# Patient Record
Sex: Female | Born: 1985 | Race: White | Hispanic: No | Marital: Single | Smoking: Current every day smoker
Health system: Southern US, Community
[De-identification: ages and names within clinical notes are randomized; demographics above are authoritative.]

## PROBLEM LIST (undated history)

## (undated) DIAGNOSIS — Z789 Other specified health status: Secondary | ICD-10-CM

## (undated) HISTORY — PX: DILATION AND EVACUATION: SHX1459

---

## 2012-01-11 NOTE — L&D Delivery Note (Signed)
Delivery Note At 5:11 PM a viable female was delivered via Vaginal, Spontaneous Delivery (Presentation: Left Occiput Anterior).  APGAR: 7, 9; weight: 3549 grams .   Placenta status: Intact, Spontaneous.  Cord: 3 vessels with the following complications: None.  Cord pH: none  Anesthesia: Epidural  Episiotomy: None Lacerations: 1st degree Suture Repair: 2.0 chromic Est. Blood Loss (mL): 350  Mom to postpartum.  Baby to Couplet care / Skin to Skin.  Ladashia Demarinis A 12/24/2012, 5:56 PM

## 2012-08-02 ENCOUNTER — Ambulatory Visit (INDEPENDENT_AMBULATORY_CARE_PROVIDER_SITE_OTHER): Payer: Medicaid Other | Admitting: Obstetrics & Gynecology

## 2012-08-02 ENCOUNTER — Encounter: Payer: Self-pay | Admitting: Obstetrics & Gynecology

## 2012-08-02 VITALS — BP 125/78 | Temp 98.4°F | Ht 63.0 in | Wt 141.0 lb

## 2012-08-02 DIAGNOSIS — Z3400 Encounter for supervision of normal first pregnancy, unspecified trimester: Secondary | ICD-10-CM | POA: Insufficient documentation

## 2012-08-02 DIAGNOSIS — O99330 Smoking (tobacco) complicating pregnancy, unspecified trimester: Secondary | ICD-10-CM | POA: Insufficient documentation

## 2012-08-02 DIAGNOSIS — O99334 Smoking (tobacco) complicating childbirth: Secondary | ICD-10-CM

## 2012-08-02 DIAGNOSIS — Z3402 Encounter for supervision of normal first pregnancy, second trimester: Secondary | ICD-10-CM

## 2012-08-02 DIAGNOSIS — Z3201 Encounter for pregnancy test, result positive: Secondary | ICD-10-CM

## 2012-08-02 DIAGNOSIS — O99332 Smoking (tobacco) complicating pregnancy, second trimester: Secondary | ICD-10-CM

## 2012-08-02 LAB — POCT URINALYSIS DIPSTICK
Glucose, UA: NEGATIVE
Leukocytes, UA: NEGATIVE
Nitrite, UA: NEGATIVE
Spec Grav, UA: 1.015
Urobilinogen, UA: NEGATIVE

## 2012-08-02 MED ORDER — PRENATAL MULTIVITAMIN CH: 1.0000 | ORAL_TABLET | Freq: Every day | ORAL | Status: DC

## 2012-08-02 NOTE — Progress Notes (Signed)
Pt is doing well.

## 2012-08-02 NOTE — Progress Notes (Signed)
Pulse- 99 . Subjective:    Maria Compton is being seen today for her first obstetrical visit.  This is not a planned pregnancy. She is at [redacted]w[redacted]d gestation. Her obstetrical history is significant for smoker. Relationship with FOB: significant other, living together. Patient does intend to breast feed. Pregnancy history fully reviewed.  Menstrual History: OB History   Grav Para Term Preterm Abortions TAB SAB Ect Mult Living   3    2           Menarche age: 83 Patient's last menstrual period was 02/28/2012.    The following portions of the patient's history were reviewed and updated as appropriate: allergies, current medications, past family history, past medical history, past social history, past surgical history and problem list.  Review of Systems Pertinent items are noted in HPI.    Objective:   General Appearance:    Alert, cooperative, no distress, appears stated age  Head:    Normocephalic, without obvious abnormality, atraumatic  Eyes:    PERRL, conjunctiva/corneas clear, EOM's intact, fundi    benign, both eyes  Ears:    Normal TM's and external ear canals, both ears  Nose:   Nares normal, septum midline, mucosa normal, no drainage    or sinus tenderness  Throat:   Lips, mucosa, and tongue normal; teeth and gums normal  Neck:   Supple, symmetrical, trachea midline, no adenopathy;    thyroid:  no enlargement/tenderness/nodules; no carotid   bruit or JVD  Back:     Symmetric, no curvature, ROM normal, no CVA tenderness  Lungs:     Clear to auscultation bilaterally, respirations unlabored  Chest Wall:    No tenderness or deformity   Heart:    Regular rate and rhythm, S1 and S2 normal, no murmur, rub   or gallop  Breast Exam:    No tenderness, masses, or nipple abnormality  Abdomen:     Soft, non-tender, bowel sounds active all four quadrants,    no masses, no organomegaly  Genitalia:    Normal female without lesion, discharge or tenderness  Extremities:   Extremities  normal, atraumatic, no cyanosis or edema  Pulses:   2+ and symmetric all extremities  Skin:   Skin color, texture, turgor normal, no rashes or lesions  Lymph nodes:   Cervical, supraclavicular, and axillary nodes normal  Neurologic:   CNII-XII intact, normal strength, sensation and reflexes    throughout    Assessment:    Pregnancy at [redacted]w[redacted]d weeks    Plan:    Initial labs drawn. Prenatal vitamins.  Counseling provided regarding continued use of seat belts, cessation of alcohol consumption, smoking or use of illicit drugs; infection precautions i.e., influenza/TDAP immunizations, toxoplasmosis,CMV, parvovirus, listeria and varicella; workplace safety, exercise during pregnancy; routine dental care, safe medications, sexual activity, hot tubs, saunas, pools, travel, caffeine use, fish and methlymercury, potential toxins, hair treatments, varicose veins Weight gain recommendations reviewed: underweight/BMI< 18.5--> gain 28 - 40 lbs; normal weight/BMI 18.5 - 24.9--> gain 25 - 35 lbs; overweight/BMI 25 - 29.9--> gain 15 - 25 lbs; obese/BMI >30->gain  11 - 20 lbs Problem list reviewed and updated.  Role of ultrasound in pregnancy discussed; fetal survey: requested. Amniocentesis discussed: not indicated. Follow up in 4 weeks. 50% of 20 min visit spent on counseling and coordination of care.

## 2012-08-02 NOTE — Patient Instructions (Addendum)
Tetanus, Diphtheria, Pertussis (Tdap) Vaccine What You Need to Know WHY GET VACCINATED? Tetanus, diphtheria and pertussis can be very serious diseases, even for adolescents and adults. Tdap vaccine can protect us from these diseases. TETANUS (Lockjaw) causes painful muscle tightening and stiffness, usually all over the body.  It can lead to tightening of muscles in the head and neck so you can't open your mouth, swallow, or sometimes even breathe. Tetanus kills about 1 out of 5 people who are infected. DIPHTHERIA can cause a thick coating to form in the back of the throat.  It can lead to breathing problems, paralysis, heart failure, and death. PERTUSSIS (Whooping Cough) causes severe coughing spells, which can cause difficulty breathing, vomiting and disturbed sleep.  It can also lead to weight loss, incontinence, and rib fractures. Up to 2 in 100 adolescents and 5 in 100 adults with pertussis are hospitalized or have complications, which could include pneumonia and death. These diseases are caused by bacteria. Diphtheria and pertussis are spread from person to person through coughing or sneezing. Tetanus enters the body through cuts, scratches, or wounds. Before vaccines, the [place] saw as many as 200,000 cases a year of diphtheria and pertussis, and hundreds of cases of tetanus. Since vaccination began, tetanus and diphtheria have dropped by about 99% and pertussis by about 80%. TDAP VACCINE Tdap vaccine can protect adolescents and adults from tetanus, diphtheria, and pertussis. One dose of Tdap is routinely given at age 11 or 12. People who did not get Tdap at that age should get it as soon as possible. Tdap is especially important for health care professionals and anyone having close contact with a baby younger than 12 months. Pregnant women should get a dose of Tdap during every pregnancy, to protect the newborn from pertussis. Infants are most at risk for severe, life-threatening  complications from pertussis. A similar vaccine, called Td, protects from tetanus and diphtheria, but not pertussis. A Td booster should be given every 10 years. Tdap may be given as one of these boosters if you have not already gotten a dose. Tdap may also be given after a severe cut or burn to prevent tetanus infection. Your doctor can give you more information. Tdap may safely be given at the same time as other vaccines. SOME PEOPLE SHOULD NOT GET THIS VACCINE  If you ever had a life-threatening allergic reaction after a dose of any tetanus, diphtheria, or pertussis containing vaccine, OR if you have a severe allergy to any part of this vaccine, you should not get Tdap. Tell your doctor if you have any severe allergies.  If you had a coma, or long or multiple seizures within 7 days after a childhood dose of DTP or DTaP, you should not get Tdap, unless a cause other than the vaccine was found. You can still get Td.  Talk to your doctor if you:  have epilepsy or another nervous system problem,  had severe pain or swelling after any vaccine containing diphtheria, tetanus or pertussis,  ever had Guillain-Barr Syndrome (GBS),  aren't feeling well on the day the shot is scheduled. RISKS OF A VACCINE REACTION With any medicine, including vaccines, there is a chance of side effects. These are usually mild and go away on their own, but serious reactions are also possible. Brief fainting spells can follow a vaccination, leading to injuries from falling. Sitting or lying down for about 15 minutes can help prevent these. Tell your doctor if you feel dizzy or light-headed, or   have vision changes or ringing in the ears. Mild problems following Tdap (Did not interfere with activities)  Pain where the shot was given (about 3 in 4 adolescents or 2 in 3 adults)  Redness or swelling where the shot was given (about 1 person in 5)  Mild fever of at least 100.4F (up to about 1 in 25 adolescents or 1 in  100 adults)  Headache (about 3 or 4 people in 10)  Tiredness (about 1 person in 3 or 4)  Nausea, vomiting, diarrhea, stomach ache (up to 1 in 4 adolescents or 1 in 10 adults)  Chills, body aches, sore joints, rash, swollen glands (uncommon) Moderate problems following Tdap (Interfered with activities, but did not require medical attention)  Pain where the shot was given (about 1 in 5 adolescents or 1 in 100 adults)  Redness or swelling where the shot was given (up to about 1 in 16 adolescents or 1 in 25 adults)  Fever over 102F (about 1 in 100 adolescents or 1 in 250 adults)  Headache (about 3 in 20 adolescents or 1 in 10 adults)  Nausea, vomiting, diarrhea, stomach ache (up to 1 or 3 people in 100)  Swelling of the entire arm where the shot was given (up to about 3 in 100). Severe problems following Tdap (Unable to perform usual activities, required medical attention)  Swelling, severe pain, bleeding and redness in the arm where the shot was given (rare). A severe allergic reaction could occur after any vaccine (estimated less than 1 in a million doses). WHAT IF THERE IS A SERIOUS REACTION? What should I look for?  Look for anything that concerns you, such as signs of a severe allergic reaction, very high fever, or behavior changes. Signs of a severe allergic reaction can include hives, swelling of the face and throat, difficulty breathing, a fast heartbeat, dizziness, and weakness. These would start a few minutes to a few hours after the vaccination. What should I do?  If you think it is a severe allergic reaction or other emergency that can't wait, call 9-1-1 or get the person to the nearest hospital. Otherwise, call your doctor.  Afterward, the reaction should be reported to the "Vaccine Adverse Event Reporting System" (VAERS). Your doctor might file this report, or you can do it yourself through the VAERS web site at www.vaers.hhs.gov, or by calling 1-800-822-7967. VAERS is  only for reporting reactions. They do not give medical advice.  THE NATIONAL VACCINE INJURY COMPENSATION PROGRAM The National Vaccine Injury Compensation Program (VICP) is a federal program that was created to compensate people who may have been injured by certain vaccines. Persons who believe they may have been injured by a vaccine can learn about the program and about filing a claim by calling 1-800-338-2382 or visiting the VICP website at www.hrsa.gov/vaccinecompensation. HOW CAN I LEARN MORE?  Ask your doctor.  Call your local or state health department.  Contact the Centers for Disease Control and Prevention (CDC):  Call 1-800-232-4636 or visit CDC's website at www.cdc.gov/vaccines. CDC Tdap Vaccine VIS (05/19/11) Document Released: 06/28/2011 Document Revised: 09/21/2011 Document Reviewed: 06/28/2011 ExitCare Patient Information 2014 [employer]. Smoking Cessation Quitting smoking is important to your health and has many advantages. However, it is not always easy to quit since nicotine is a very addictive drug. Often times, people try 3 times or more before being able to quit. This document explains the best ways for you to prepare to quit smoking. Quitting takes hard work and a lot   of effort, but you can do it. ADVANTAGES OF QUITTING SMOKING  You will live longer, feel better, and live better.  Your body will feel the impact of quitting smoking almost immediately.  Within 20 minutes, blood pressure decreases. Your pulse returns to its normal level.  After 8 hours, carbon monoxide levels in the blood return to normal. Your oxygen level increases.  After 24 hours, the chance of having a heart attack starts to decrease. Your breath, hair, and body stop smelling like smoke.  After 48 hours, damaged nerve endings begin to recover. Your sense of taste and smell improve.  After 72 hours, the body is virtually free of nicotine. Your bronchial tubes relax and breathing becomes  easier.  After 2 to 12 weeks, lungs can hold more air. Exercise becomes easier and circulation improves.  The risk of having a heart attack, stroke, cancer, or lung disease is greatly reduced.  After 1 year, the risk of coronary heart disease is cut in half.  After 5 years, the risk of stroke falls to the same as a nonsmoker.  After 10 years, the risk of lung cancer is cut in half and the risk of other cancers decreases significantly.  After 15 years, the risk of coronary heart disease drops, usually to the level of a nonsmoker.  If you are pregnant, quitting smoking will improve your chances of having a healthy baby.  The people you live with, especially any children, will be healthier.  You will have extra money to spend on things other than cigarettes. QUESTIONS TO THINK ABOUT BEFORE ATTEMPTING TO QUIT You may want to talk about your answers with your caregiver.  Why do you want to quit?  If you tried to quit in the past, what helped and what did not?  What will be the most difficult situations for you after you quit? How will you plan to handle them?  Who can help you through the tough times? Your family? Friends? A caregiver?  What pleasures do you get from smoking? What ways can you still get pleasure if you quit? Here are some questions to ask your caregiver:  How can you help me to be successful at quitting?  What medicine do you think would be best for me and how should I take it?  What should I do if I need more help?  What is smoking withdrawal like? How can I get information on withdrawal? GET READY  Set a quit date.  Change your environment by getting rid of all cigarettes, ashtrays, matches, and lighters in your home, car, or work. Do not let people smoke in your home.  Review your past attempts to quit. Think about what worked and what did not. GET SUPPORT AND ENCOURAGEMENT You have a better chance of being successful if you have help. You can get  support in many ways.  Tell your family, friends, and co-workers that you are going to quit and need their support. Ask them not to smoke around you.  Get individual, group, or telephone counseling and support. Programs are available at local hospitals and health centers. Call your local health department for information about programs in your area.  Spiritual beliefs and practices may help some smokers quit.  Download a "quit meter" on your computer to keep track of quit statistics, such as how long you have gone without smoking, cigarettes not smoked, and money saved.  Get a self-help book about quitting smoking and staying off of tobacco. LEARN NEW   SKILLS AND BEHAVIORS  Distract yourself from urges to smoke. Talk to someone, go for a walk, or occupy your time with a task.  Change your normal routine. Take a different route to work. Drink tea instead of coffee. Eat breakfast in a different place.  Reduce your stress. Take a hot bath, exercise, or read a book.  Plan something enjoyable to do every day. Reward yourself for not smoking.  Explore interactive web-based programs that specialize in helping you quit. GET MEDICINE AND USE IT CORRECTLY Medicines can help you stop smoking and decrease the urge to smoke. Combining medicine with the above behavioral methods and support can greatly increase your chances of successfully quitting smoking.  Nicotine replacement therapy helps deliver nicotine to your body without the negative effects and risks of smoking. Nicotine replacement therapy includes nicotine gum, lozenges, inhalers, nasal sprays, and skin patches. Some may be available over-the-counter and others require a prescription.  Antidepressant medicine helps people abstain from smoking, but how this works is unknown. This medicine is available by prescription.  Nicotinic receptor partial agonist medicine simulates the effect of nicotine in your brain. This medicine is available by  prescription. Ask your caregiver for advice about which medicines to use and how to use them based on your health history. Your caregiver will tell you what side effects to look out for if you choose to be on a medicine or therapy. Carefully read the information on the package. Do not use any other product containing nicotine while using a nicotine replacement product.  RELAPSE OR DIFFICULT SITUATIONS Most relapses occur within the first 3 months after quitting. Do not be discouraged if you start smoking again. Remember, most people try several times before finally quitting. You may have symptoms of withdrawal because your body is used to nicotine. You may crave cigarettes, be irritable, feel very hungry, cough often, get headaches, or have difficulty concentrating. The withdrawal symptoms are only temporary. They are strongest when you first quit, but they will go away within 10 14 days. To reduce the chances of relapse, try to:  Avoid drinking alcohol. Drinking lowers your chances of successfully quitting.  Reduce the amount of caffeine you consume. Once you quit smoking, the amount of caffeine in your body increases and can give you symptoms, such as a rapid heartbeat, sweating, and anxiety.  Avoid smokers because they can make you want to smoke.  Do not let weight gain distract you. Many smokers will gain weight when they quit, usually less than 10 pounds. Eat a healthy diet and stay active. You can always lose the weight gained after you quit.  Find ways to improve your mood other than smoking. FOR MORE INFORMATION  www.smokefree.gov  Document Released: 12/21/2000 Document Revised: 06/28/2011 Document Reviewed: 04/07/2011 Exit[employer] Patient Information 2014 [employer].  

## 2012-08-03 LAB — OBSTETRIC PANEL
Antibody Screen: NEGATIVE
Basophils Absolute: 0 10*3/uL (ref 0.0–0.1)
HCT: 33.2 % — ABNORMAL LOW (ref 36.0–46.0)
Hemoglobin: 11.6 g/dL — ABNORMAL LOW (ref 12.0–15.0)
Hepatitis B Surface Ag: NEGATIVE
Lymphocytes Relative: 13 % (ref 12–46)
Monocytes Absolute: 0.9 10*3/uL (ref 0.1–1.0)
Monocytes Relative: 7 % (ref 3–12)
Neutro Abs: 10.2 10*3/uL — ABNORMAL HIGH (ref 1.7–7.7)
Rubella: 1.45 Index — ABNORMAL HIGH (ref <0.90)
WBC: 13.4 10*3/uL — ABNORMAL HIGH (ref 4.0–10.5)

## 2012-08-03 LAB — PAP IG AND CT-NG NAA: GC Probe Amp: NEGATIVE

## 2012-08-04 LAB — CULTURE, OB URINE: Colony Count: NO GROWTH

## 2012-08-05 ENCOUNTER — Encounter: Payer: Self-pay | Admitting: Obstetrics & Gynecology

## 2012-08-07 ENCOUNTER — Other Ambulatory Visit: Payer: Self-pay | Admitting: *Deleted

## 2012-08-07 DIAGNOSIS — Z3402 Encounter for supervision of normal first pregnancy, second trimester: Secondary | ICD-10-CM

## 2012-08-14 ENCOUNTER — Ambulatory Visit (INDEPENDENT_AMBULATORY_CARE_PROVIDER_SITE_OTHER): Payer: Medicaid Other

## 2012-08-14 ENCOUNTER — Other Ambulatory Visit: Payer: Medicaid Other

## 2012-08-14 DIAGNOSIS — Z3402 Encounter for supervision of normal first pregnancy, second trimester: Secondary | ICD-10-CM

## 2012-08-14 DIAGNOSIS — Z3A22 22 weeks gestation of pregnancy: Secondary | ICD-10-CM

## 2012-08-14 DIAGNOSIS — Z3400 Encounter for supervision of normal first pregnancy, unspecified trimester: Secondary | ICD-10-CM

## 2012-08-14 LAB — CBC
HCT: 33.5 % — ABNORMAL LOW (ref 36.0–46.0)
Hemoglobin: 11.5 g/dL — ABNORMAL LOW (ref 12.0–15.0)
WBC: 15.6 10*3/uL — ABNORMAL HIGH (ref 4.0–10.5)

## 2012-08-15 ENCOUNTER — Encounter: Payer: Self-pay | Admitting: Obstetrics & Gynecology

## 2012-08-15 LAB — US OB DETAIL + 14 WK

## 2012-08-15 LAB — GLUCOSE TOLERANCE, 2 HOURS W/ 1HR: Glucose, 2 hour: 55 mg/dL — ABNORMAL LOW (ref 70–139)

## 2012-08-15 LAB — RPR

## 2012-08-23 ENCOUNTER — Inpatient Hospital Stay (HOSPITAL_COMMUNITY): Admission: AD | Admit: 2012-08-23 | Payer: Self-pay | Source: Ambulatory Visit | Admitting: Obstetrics

## 2012-09-04 ENCOUNTER — Ambulatory Visit (INDEPENDENT_AMBULATORY_CARE_PROVIDER_SITE_OTHER): Payer: Medicaid Other | Admitting: Advanced Practice Midwife

## 2012-09-04 ENCOUNTER — Encounter: Payer: Self-pay | Admitting: Advanced Practice Midwife

## 2012-09-04 VITALS — BP 110/66 | Temp 97.5°F | Wt 144.0 lb

## 2012-09-04 DIAGNOSIS — Z3400 Encounter for supervision of normal first pregnancy, unspecified trimester: Secondary | ICD-10-CM

## 2012-09-04 DIAGNOSIS — Z3402 Encounter for supervision of normal first pregnancy, second trimester: Secondary | ICD-10-CM

## 2012-09-04 LAB — POCT URINALYSIS DIPSTICK
Ketones, UA: NEGATIVE
Protein, UA: NEGATIVE
Spec Grav, UA: 1.02
pH, UA: 6

## 2012-09-04 MED ORDER — CITRANATAL HARMONY 30-1-260 MG PO CAPS: 1.0000 | ORAL_CAPSULE | Freq: Every day | ORAL | Status: DC

## 2012-09-04 NOTE — Progress Notes (Signed)
Routine Obstetrical Visit  Subjective:    Maria Compton is being seen today for her routine obstetrical visit. She is at [redacted]w[redacted]d gestation.   Patient reports no complaints. Denies LOF, bleeding or contractions.  Smoking 1 CPD. Partner also smokes.   Expecting a boy, "Maria Compton". Uncertain of LMP.   Review of [place]: Size does not equal dates. EFW 14%, measuring 22 weeks @ 24 weeks.   Patient fell over the weekend, feeling + fetal movement.    Objective:     BP 110/66  Temp(Src) 97.5 F (36.4 C)  Wt 144 lb (65.318 kg)  BMI 25.51 kg/m2  LMP 02/28/2012 FHR 160, + fetal movement FH 24    Assessment:    Pregnancy: [id number] Patient Active Problem List   Diagnosis Date Noted  . Supervision of normal first pregnancy 09/04/2012  . Supervision of normal first pregnancy 08/02/2012  . Tobacco smoking complicating pregnancy 08/02/2012       Plan:     Prenatal vitamins. Ordered Problem list reviewed and updated. Repeat US requested. Follow up in 2 weeks. Encouraged smoking cessation. Reviewed importance of BRF, patient has resources for classes. Reviewed for patient to go to hospital for monitoring if she were to fall again in pregnancy.  80% of 20 min visit spent on counseling and coordination of care.   Maria Compton   [place], [place] 09/04/2012

## 2012-09-04 NOTE — Progress Notes (Signed)
P 95 Patient states she feel at work yesterday- she is feeling the baby move and no unusual discharge.

## 2012-09-05 ENCOUNTER — Encounter: Payer: Medicaid Other | Admitting: Obstetrics & Gynecology

## 2012-09-18 ENCOUNTER — Encounter: Payer: Self-pay | Admitting: Advanced Practice Midwife

## 2012-09-18 ENCOUNTER — Ambulatory Visit (INDEPENDENT_AMBULATORY_CARE_PROVIDER_SITE_OTHER): Payer: Medicaid Other | Admitting: Advanced Practice Midwife

## 2012-09-18 ENCOUNTER — Ambulatory Visit (INDEPENDENT_AMBULATORY_CARE_PROVIDER_SITE_OTHER): Payer: Medicaid Other

## 2012-09-18 ENCOUNTER — Other Ambulatory Visit: Payer: Self-pay | Admitting: Advanced Practice Midwife

## 2012-09-18 VITALS — BP 126/77 | Temp 98.1°F | Wt 148.0 lb

## 2012-09-18 DIAGNOSIS — Z3402 Encounter for supervision of normal first pregnancy, second trimester: Secondary | ICD-10-CM

## 2012-09-18 DIAGNOSIS — Z3403 Encounter for supervision of normal first pregnancy, third trimester: Secondary | ICD-10-CM

## 2012-09-18 DIAGNOSIS — Z3400 Encounter for supervision of normal first pregnancy, unspecified trimester: Secondary | ICD-10-CM

## 2012-09-18 DIAGNOSIS — K219 Gastro-esophageal reflux disease without esophagitis: Secondary | ICD-10-CM

## 2012-09-18 DIAGNOSIS — O365931 Maternal care for other known or suspected poor fetal growth, third trimester, fetus 1: Secondary | ICD-10-CM

## 2012-09-18 MED ORDER — OMEPRAZOLE 20 MG PO CPDR: 20.0000 mg | DELAYED_RELEASE_CAPSULE | Freq: Every day | ORAL | Status: DC

## 2012-09-18 NOTE — Progress Notes (Signed)
P 97 Patient reports she is doing well today.

## 2012-09-19 ENCOUNTER — Encounter: Payer: Self-pay | Admitting: Obstetrics & Gynecology

## 2012-09-19 LAB — US OB DETAIL + 14 WK

## 2012-09-20 ENCOUNTER — Encounter: Payer: Self-pay | Admitting: Obstetrics & Gynecology

## 2012-09-20 LAB — US OB DETAIL + 14 WK

## 2012-10-01 ENCOUNTER — Ambulatory Visit (INDEPENDENT_AMBULATORY_CARE_PROVIDER_SITE_OTHER): Payer: Medicaid Other | Admitting: Obstetrics & Gynecology

## 2012-10-01 ENCOUNTER — Telehealth: Payer: Self-pay | Admitting: Obstetrics & Gynecology

## 2012-10-01 VITALS — BP 117/70 | Temp 97.5°F | Wt 152.0 lb

## 2012-10-01 DIAGNOSIS — Z23 Encounter for immunization: Secondary | ICD-10-CM

## 2012-10-01 DIAGNOSIS — Z3403 Encounter for supervision of normal first pregnancy, third trimester: Secondary | ICD-10-CM

## 2012-10-01 DIAGNOSIS — Z3400 Encounter for supervision of normal first pregnancy, unspecified trimester: Secondary | ICD-10-CM

## 2012-10-01 LAB — POCT URINALYSIS DIPSTICK
Glucose, UA: NEGATIVE
Nitrite, UA: NEGATIVE
Protein, UA: NEGATIVE
Spec Grav, UA: 1.015
Urobilinogen, UA: NEGATIVE

## 2012-10-01 NOTE — Telephone Encounter (Signed)
Message copied by Jomarie Longs on Mon Oct 01, 2012  8:28 AM ------      Message from: Glendell Docker      Created: Sun Sep 30, 2012  1:39 PM      Regarding: [place]        Needs repeat growth U/S this week per Dr Tamela Oddi ------

## 2012-10-01 NOTE — Telephone Encounter (Signed)
APPT SCHEDULED FOR Tuesday FOR REPEAT U/S

## 2012-10-01 NOTE — Progress Notes (Signed)
P 85 Patient reports she is doing well.

## 2012-10-02 ENCOUNTER — Ambulatory Visit (INDEPENDENT_AMBULATORY_CARE_PROVIDER_SITE_OTHER): Payer: Medicaid Other

## 2012-10-02 ENCOUNTER — Other Ambulatory Visit: Payer: Self-pay | Admitting: Obstetrics & Gynecology

## 2012-10-02 DIAGNOSIS — Z3689 Encounter for other specified antenatal screening: Secondary | ICD-10-CM

## 2012-10-02 DIAGNOSIS — Z3400 Encounter for supervision of normal first pregnancy, unspecified trimester: Secondary | ICD-10-CM

## 2012-10-15 ENCOUNTER — Encounter: Payer: Self-pay | Admitting: Obstetrics & Gynecology

## 2012-10-16 ENCOUNTER — Encounter: Payer: Self-pay | Admitting: Obstetrics

## 2012-10-16 ENCOUNTER — Ambulatory Visit (INDEPENDENT_AMBULATORY_CARE_PROVIDER_SITE_OTHER): Payer: Medicaid Other | Admitting: Obstetrics

## 2012-10-16 VITALS — BP 127/78 | Temp 98.0°F | Wt 151.0 lb

## 2012-10-16 DIAGNOSIS — Z3403 Encounter for supervision of normal first pregnancy, third trimester: Secondary | ICD-10-CM

## 2012-10-16 DIAGNOSIS — R300 Dysuria: Secondary | ICD-10-CM

## 2012-10-16 DIAGNOSIS — Z3400 Encounter for supervision of normal first pregnancy, unspecified trimester: Secondary | ICD-10-CM

## 2012-10-16 LAB — POCT URINALYSIS DIPSTICK
Bilirubin, UA: NEGATIVE
Blood, UA: NEGATIVE
Glucose, UA: NEGATIVE
Ketones, UA: NEGATIVE
Leukocytes, UA: NEGATIVE
Nitrite, UA: NEGATIVE
Protein, UA: NEGATIVE
Spec Grav, UA: 1.015
Urobilinogen, UA: NEGATIVE

## 2012-10-16 NOTE — Progress Notes (Signed)
Pulse- 94 

## 2012-10-30 ENCOUNTER — Encounter: Payer: Self-pay | Admitting: Obstetrics

## 2012-10-30 ENCOUNTER — Ambulatory Visit (INDEPENDENT_AMBULATORY_CARE_PROVIDER_SITE_OTHER): Payer: Medicaid Other | Admitting: Obstetrics

## 2012-10-30 VITALS — BP 105/63 | Temp 97.7°F | Wt 156.0 lb

## 2012-10-30 DIAGNOSIS — Z3483 Encounter for supervision of other normal pregnancy, third trimester: Secondary | ICD-10-CM

## 2012-10-30 DIAGNOSIS — Z23 Encounter for immunization: Secondary | ICD-10-CM

## 2012-10-30 DIAGNOSIS — Z3480 Encounter for supervision of other normal pregnancy, unspecified trimester: Secondary | ICD-10-CM

## 2012-10-30 LAB — POCT URINALYSIS DIPSTICK
Bilirubin, UA: NEGATIVE
Blood, UA: NEGATIVE
Leukocytes, UA: NEGATIVE
Nitrite, UA: NEGATIVE
Protein, UA: NEGATIVE
Spec Grav, UA: 1.015
Urobilinogen, UA: NEGATIVE
pH, UA: 6

## 2012-10-30 NOTE — Progress Notes (Signed)
Pulse- 95 Pt states she is having pelvic pressure.

## 2012-11-13 ENCOUNTER — Ambulatory Visit (INDEPENDENT_AMBULATORY_CARE_PROVIDER_SITE_OTHER): Payer: Medicaid Other | Admitting: Obstetrics

## 2012-11-13 ENCOUNTER — Encounter: Payer: Self-pay | Admitting: Obstetrics

## 2012-11-13 VITALS — BP 119/73 | Temp 97.9°F | Wt 155.0 lb

## 2012-11-13 DIAGNOSIS — Z3400 Encounter for supervision of normal first pregnancy, unspecified trimester: Secondary | ICD-10-CM

## 2012-11-13 DIAGNOSIS — Z3403 Encounter for supervision of normal first pregnancy, third trimester: Secondary | ICD-10-CM

## 2012-11-13 LAB — POCT URINALYSIS DIPSTICK
Blood, UA: NEGATIVE
Glucose, UA: NEGATIVE
Ketones, UA: NEGATIVE
Spec Grav, UA: 1.015
Urobilinogen, UA: NEGATIVE
pH, UA: 6

## 2012-11-13 NOTE — Progress Notes (Signed)
Pulse- 80 Pt states she is having vaginal pressure.

## 2012-11-20 ENCOUNTER — Ambulatory Visit (INDEPENDENT_AMBULATORY_CARE_PROVIDER_SITE_OTHER): Payer: Medicaid Other | Admitting: Obstetrics

## 2012-11-20 VITALS — BP 127/76 | Temp 98.0°F | Wt 159.0 lb

## 2012-11-20 DIAGNOSIS — Z3400 Encounter for supervision of normal first pregnancy, unspecified trimester: Secondary | ICD-10-CM

## 2012-11-20 DIAGNOSIS — Z3403 Encounter for supervision of normal first pregnancy, third trimester: Secondary | ICD-10-CM

## 2012-11-20 LAB — POCT URINALYSIS DIPSTICK
Bilirubin, UA: NEGATIVE
Ketones, UA: NEGATIVE
Leukocytes, UA: NEGATIVE
Spec Grav, UA: 1.02
pH, UA: 5

## 2012-11-20 NOTE — Progress Notes (Signed)
Pulse- 101 Pt states she is having lower abdomen pressure. Pt states she is having increased cramping and contractions happening when she is standing on her her feet at work. Pt states she is also having some contractions in the evenings at home

## 2012-11-21 ENCOUNTER — Encounter: Payer: Self-pay | Admitting: Obstetrics

## 2012-11-27 ENCOUNTER — Encounter: Payer: Self-pay | Admitting: Obstetrics

## 2012-11-27 ENCOUNTER — Ambulatory Visit (INDEPENDENT_AMBULATORY_CARE_PROVIDER_SITE_OTHER): Payer: Medicaid Other | Admitting: Obstetrics

## 2012-11-27 VITALS — BP 117/77 | Temp 97.1°F | Wt 159.0 lb

## 2012-11-27 DIAGNOSIS — Z3400 Encounter for supervision of normal first pregnancy, unspecified trimester: Secondary | ICD-10-CM

## 2012-11-27 DIAGNOSIS — Z3403 Encounter for supervision of normal first pregnancy, third trimester: Secondary | ICD-10-CM

## 2012-11-27 LAB — POCT URINALYSIS DIPSTICK
Bilirubin, UA: NEGATIVE
Blood, UA: NEGATIVE
Glucose, UA: NEGATIVE
Ketones, UA: NEGATIVE
Leukocytes, UA: NEGATIVE
Nitrite, UA: NEGATIVE
Spec Grav, UA: 1.015
pH, UA: 5

## 2012-11-27 NOTE — Progress Notes (Signed)
Pulse: 88

## 2012-12-04 ENCOUNTER — Ambulatory Visit (INDEPENDENT_AMBULATORY_CARE_PROVIDER_SITE_OTHER): Payer: Medicaid Other | Admitting: Obstetrics

## 2012-12-04 VITALS — BP 142/81 | Temp 98.0°F | Wt 163.0 lb

## 2012-12-04 DIAGNOSIS — Z3483 Encounter for supervision of other normal pregnancy, third trimester: Secondary | ICD-10-CM

## 2012-12-04 DIAGNOSIS — Z3480 Encounter for supervision of other normal pregnancy, unspecified trimester: Secondary | ICD-10-CM

## 2012-12-04 LAB — POCT URINALYSIS DIPSTICK
Bilirubin, UA: NEGATIVE
Blood, UA: NEGATIVE
Glucose, UA: NEGATIVE
Ketones, UA: NEGATIVE
Nitrite, UA: NEGATIVE
Urobilinogen, UA: NEGATIVE
pH, UA: 6

## 2012-12-04 NOTE — Progress Notes (Signed)
P 90 Patient reports she is doing well- ready to deliver

## 2012-12-05 ENCOUNTER — Encounter: Payer: Self-pay | Admitting: Obstetrics

## 2012-12-11 ENCOUNTER — Ambulatory Visit (INDEPENDENT_AMBULATORY_CARE_PROVIDER_SITE_OTHER): Payer: Medicaid Other | Admitting: Obstetrics

## 2012-12-11 ENCOUNTER — Encounter: Payer: Self-pay | Admitting: Obstetrics

## 2012-12-11 VITALS — BP 112/77 | Temp 98.9°F | Wt 164.0 lb

## 2012-12-11 DIAGNOSIS — Z3480 Encounter for supervision of other normal pregnancy, unspecified trimester: Secondary | ICD-10-CM

## 2012-12-11 DIAGNOSIS — Z3483 Encounter for supervision of other normal pregnancy, third trimester: Secondary | ICD-10-CM

## 2012-12-11 LAB — POCT URINALYSIS DIPSTICK
Glucose, UA: NEGATIVE
Ketones, UA: NEGATIVE
Nitrite, UA: NEGATIVE
Spec Grav, UA: 1.015
Urobilinogen, UA: NEGATIVE

## 2012-12-11 NOTE — Progress Notes (Signed)
HR - 89 Pt in office today for routine OB visit, would like to discuss having and induction

## 2012-12-18 ENCOUNTER — Ambulatory Visit (INDEPENDENT_AMBULATORY_CARE_PROVIDER_SITE_OTHER): Payer: Medicaid Other | Admitting: Obstetrics

## 2012-12-18 ENCOUNTER — Encounter: Payer: Medicaid Other | Admitting: Obstetrics

## 2012-12-18 VITALS — BP 128/83 | Temp 98.1°F | Wt 164.0 lb

## 2012-12-18 DIAGNOSIS — Z3400 Encounter for supervision of normal first pregnancy, unspecified trimester: Secondary | ICD-10-CM

## 2012-12-18 DIAGNOSIS — Z3403 Encounter for supervision of normal first pregnancy, third trimester: Secondary | ICD-10-CM

## 2012-12-18 DIAGNOSIS — O480 Post-term pregnancy: Secondary | ICD-10-CM

## 2012-12-18 LAB — POCT URINALYSIS DIPSTICK
Blood, UA: NEGATIVE
Glucose, UA: NEGATIVE
Nitrite, UA: NEGATIVE
Spec Grav, UA: 1.015
Urobilinogen, UA: NEGATIVE

## 2012-12-18 NOTE — Progress Notes (Signed)
Pulse 80 Pt states that she is having some irregular contractions.  Pt would like to have her membranes stripped at todays visit.  Pt is curious as to if she will have induction.

## 2012-12-19 ENCOUNTER — Encounter: Payer: Self-pay | Admitting: Obstetrics

## 2012-12-23 ENCOUNTER — Inpatient Hospital Stay (HOSPITAL_COMMUNITY)
Admission: AD | Admit: 2012-12-23 | Discharge: 2012-12-23 | Disposition: A | Discharge status: Home / Self Care | Payer: Medicaid Other | Source: Ambulatory Visit | Attending: Obstetrics | Admitting: Obstetrics

## 2012-12-23 ENCOUNTER — Encounter (HOSPITAL_COMMUNITY): Payer: Self-pay | Admitting: *Deleted

## 2012-12-23 ENCOUNTER — Inpatient Hospital Stay (HOSPITAL_COMMUNITY)
Admission: AD | Admit: 2012-12-23 | Discharge: 2012-12-26 | DRG: 775 | Disposition: A | Discharge status: Home / Self Care | Payer: Medicaid Other | Source: Ambulatory Visit | Attending: Obstetrics | Admitting: Obstetrics

## 2012-12-23 ENCOUNTER — Encounter (HOSPITAL_COMMUNITY): Payer: Medicaid Other | Admitting: Anesthesiology

## 2012-12-23 ENCOUNTER — Inpatient Hospital Stay (HOSPITAL_COMMUNITY): Payer: Medicaid Other | Admitting: Anesthesiology

## 2012-12-23 DIAGNOSIS — O99332 Smoking (tobacco) complicating pregnancy, second trimester: Secondary | ICD-10-CM

## 2012-12-23 DIAGNOSIS — IMO0001 Reserved for inherently not codable concepts without codable children: Secondary | ICD-10-CM

## 2012-12-23 DIAGNOSIS — Z3402 Encounter for supervision of normal first pregnancy, second trimester: Secondary | ICD-10-CM

## 2012-12-23 DIAGNOSIS — O99334 Smoking (tobacco) complicating childbirth: Secondary | ICD-10-CM | POA: Diagnosis present

## 2012-12-23 DIAGNOSIS — O479 False labor, unspecified: Secondary | ICD-10-CM | POA: Insufficient documentation

## 2012-12-23 HISTORY — DX: Other specified health status: Z78.9

## 2012-12-23 LAB — CBC
Hemoglobin: 13.1 g/dL (ref 12.0–15.0)
MCH: 30.2 pg (ref 26.0–34.0)
MCHC: 36.1 g/dL — ABNORMAL HIGH (ref 30.0–36.0)
MCV: 83.6 fL (ref 78.0–100.0)
Platelets: 304 10*3/uL (ref 150–400)
RBC: 4.34 MIL/uL (ref 3.87–5.11)
RDW: 13.3 % (ref 11.5–15.5)
WBC: 24.6 10*3/uL — ABNORMAL HIGH (ref 4.0–10.5)

## 2012-12-23 LAB — RPR: RPR Ser Ql: NONREACTIVE

## 2012-12-23 MED ORDER — EPHEDRINE 5 MG/ML INJ
10.0000 mg | INTRAVENOUS | Status: DC | PRN
  Filled 2012-12-23: qty 2

## 2012-12-23 MED ORDER — OXYCODONE-ACETAMINOPHEN 5-325 MG PO TABS: 1.0000 | ORAL_TABLET | ORAL | Status: DC | PRN

## 2012-12-23 MED ORDER — LACTATED RINGERS IV SOLN
500.0000 mL | INTRAVENOUS | Status: DC | PRN
  Administered 2012-12-24 (×2): 500 mL via INTRAVENOUS

## 2012-12-23 MED ORDER — PHENYLEPHRINE 40 MCG/ML (10ML) SYRINGE FOR IV PUSH (FOR BLOOD PRESSURE SUPPORT)
80.0000 ug | PREFILLED_SYRINGE | INTRAVENOUS | Status: DC | PRN
  Filled 2012-12-23: qty 2

## 2012-12-23 MED ORDER — OXYTOCIN 40 UNITS IN LACTATED RINGERS INFUSION - SIMPLE MED: 1.0000 m[IU]/min | INTRAVENOUS | Status: DC

## 2012-12-23 MED ORDER — OXYTOCIN 40 UNITS IN LACTATED RINGERS INFUSION - SIMPLE MED
62.5000 mL/h | INTRAVENOUS | Status: DC
  Administered 2012-12-24: 62.5 mL/h via INTRAVENOUS
  Filled 2012-12-23: qty 1000

## 2012-12-23 MED ORDER — IBUPROFEN 600 MG PO TABS: 600.0000 mg | ORAL_TABLET | Freq: Four times a day (QID) | ORAL | Status: DC | PRN

## 2012-12-23 MED ORDER — ONDANSETRON HCL 4 MG/2ML IJ SOLN
4.0000 mg | Freq: Four times a day (QID) | INTRAMUSCULAR | Status: DC | PRN
  Administered 2012-12-23: 4 mg via INTRAVENOUS
  Filled 2012-12-23: qty 2

## 2012-12-23 MED ORDER — EPHEDRINE 5 MG/ML INJ
INTRAVENOUS | Status: AC
  Filled 2012-12-23: qty 4

## 2012-12-23 MED ORDER — SODIUM BICARBONATE 8.4 % IV SOLN
INTRAVENOUS | Status: DC | PRN
  Administered 2012-12-23: 5 mL via EPIDURAL

## 2012-12-23 MED ORDER — OXYTOCIN BOLUS FROM INFUSION: 500.0000 mL | INTRAVENOUS | Status: DC

## 2012-12-23 MED ORDER — ACETAMINOPHEN 325 MG PO TABS: 650.0000 mg | ORAL_TABLET | ORAL | Status: DC | PRN

## 2012-12-23 MED ORDER — CITRIC ACID-SODIUM CITRATE 334-500 MG/5ML PO SOLN: 30.0000 mL | ORAL | Status: DC | PRN

## 2012-12-23 MED ORDER — PHENYLEPHRINE 40 MCG/ML (10ML) SYRINGE FOR IV PUSH (FOR BLOOD PRESSURE SUPPORT)
PREFILLED_SYRINGE | INTRAVENOUS | Status: AC
  Filled 2012-12-23: qty 10

## 2012-12-23 MED ORDER — LACTATED RINGERS IV SOLN
INTRAVENOUS | Status: DC
  Administered 2012-12-23 – 2012-12-24 (×4): via INTRAVENOUS

## 2012-12-23 MED ORDER — TERBUTALINE SULFATE 1 MG/ML IJ SOLN: 0.2500 mg | Freq: Once | INTRAMUSCULAR | Status: AC | PRN

## 2012-12-23 MED ORDER — LACTATED RINGERS IV SOLN
500.0000 mL | Freq: Once | INTRAVENOUS | Status: AC
  Administered 2012-12-23: 500 mL via INTRAVENOUS

## 2012-12-23 MED ORDER — OXYTOCIN 40 UNITS IN LACTATED RINGERS INFUSION - SIMPLE MED
1.0000 m[IU]/min | INTRAVENOUS | Status: DC
  Administered 2012-12-23: 2 m[IU]/min via INTRAVENOUS

## 2012-12-23 MED ORDER — FENTANYL 2.5 MCG/ML BUPIVACAINE 1/10 % EPIDURAL INFUSION (WH - ANES)
INTRAMUSCULAR | Status: AC
  Filled 2012-12-23: qty 125

## 2012-12-23 MED ORDER — FLEET ENEMA 7-19 GM/118ML RE ENEM: 1.0000 | ENEMA | Freq: Once | RECTAL | Status: DC

## 2012-12-23 MED ORDER — DIPHENHYDRAMINE HCL 50 MG/ML IJ SOLN: 12.5000 mg | INTRAMUSCULAR | Status: DC | PRN

## 2012-12-23 MED ORDER — LIDOCAINE HCL (PF) 1 % IJ SOLN
30.0000 mL | INTRAMUSCULAR | Status: DC | PRN
  Filled 2012-12-23 (×2): qty 30

## 2012-12-23 MED ORDER — FENTANYL 2.5 MCG/ML BUPIVACAINE 1/10 % EPIDURAL INFUSION (WH - ANES)
14.0000 mL/h | INTRAMUSCULAR | Status: DC | PRN
  Administered 2012-12-23 – 2012-12-24 (×5): 14 mL/h via EPIDURAL
  Filled 2012-12-23 (×4): qty 125

## 2012-12-23 NOTE — MAU Note (Signed)
Ctx every 3-5 mins x 1.5 hrs. Denies LOF or VB. Positive fetal movement. Denies complications with pregnancy.

## 2012-12-23 NOTE — Anesthesia Preprocedure Evaluation (Signed)
Anesthesia Evaluation  Patient identified by MRN, date of birth, ID band Patient awake    Reviewed: Allergy & Precautions, H&P , Patient's Chart, lab work & pertinent test results  Airway Mallampati: II TM Distance: >3 FB Neck ROM: full    Dental  (+) Teeth Intact   Pulmonary Current Smoker,  breath sounds clear to auscultation        Cardiovascular Rhythm:regular Rate:Normal     Neuro/Psych    GI/Hepatic GERD-  Medicated,  Endo/Other    Renal/GU      Musculoskeletal   Abdominal   Peds  Hematology   Anesthesia Other Findings       Reproductive/Obstetrics (+) Pregnancy                           Anesthesia Physical Anesthesia Plan  ASA: II  Anesthesia Plan: Epidural   Post-op Pain Management:    Induction:   Airway Management Planned:   Additional Equipment:   Intra-op Plan:   Post-operative Plan:   Informed Consent: I have reviewed the patients History and Physical, chart, labs and discussed the procedure including the risks, benefits and alternatives for the proposed anesthesia with the patient or authorized representative who has indicated his/her understanding and acceptance.   Dental Advisory Given  Plan Discussed with:   Anesthesia Plan Comments: (Labs checked- platelets confirmed with RN in room. Fetal heart tracing, per RN, reported to be stable enough for sitting procedure. Discussed epidural, and patient consents to the procedure:  included risk of possible headache,backache, failed block, allergic reaction, and nerve injury. This patient was asked if she had any questions or concerns before the procedure started. )        Anesthesia Quick Evaluation

## 2012-12-23 NOTE — Progress Notes (Signed)
Anesthesia at bs for epidural

## 2012-12-23 NOTE — Anesthesia Procedure Notes (Signed)

## 2012-12-23 NOTE — Progress Notes (Signed)
Maria Compton is a 27 y.o. G3P0020 at [redacted]w[redacted]d by LMP admitted for active labor  Subjective: Comfortable  Objective: BP 102/54  Pulse 88  Temp(Src) 98.2 F (36.8 C) (Oral)  Resp 18  Ht 5\' 3"  (1.6 m)  Wt 74.021 kg (163 lb 3 oz)  BMI 28.91 kg/m2  SpO2 92%  LMP 02/28/2012 I/O last 3 completed shifts: In: -  Out: 250 [Urine:250] Total I/O In: -  Out: 300 [Urine:300]  FHT:  FHR: 150 bpm, variability: moderate,  accelerations:  Present,  decelerations:  Absent UC:   irregular, every 4 minutes SVE:   Dilation: 4.5 Effacement (%): 100 Station: -1;-2 Exam by:: GPayne, RN AROM--light meconium, IUPC placed Labs: Lab Results  Component Value Date   WBC 24.6* 12/23/2012   HGB 13.1 12/23/2012   HCT 36.3 12/23/2012   MCV 83.6 12/23/2012   PLT 304 12/23/2012    Assessment / Plan: Latent labor, meconium staining  Labor: titrate Pitocin-->200 - 300 MVU Preeclampsia:  n/a Fetal Wellbeing:  Category I Pain Control:  Epidural I/D:  n/a Anticipated MOD:  NSVD  JACKSON-MOORE,Jina Olenick A 12/23/2012, 9:30 PM

## 2012-12-23 NOTE — H&P (Signed)
Maria Compton is a 27 y.o. female presenting for contractions. Maternal Medical History:  Reason for admission: Contractions.   Contractions: Onset was 6-12 hours ago.   Frequency: regular.    Fetal activity: Perceived fetal activity is normal.    Prenatal complications: Substance abuse: tobacco use.   Prenatal Complications - Diabetes: none.    OB History   Grav Para Term Preterm Abortions TAB SAB Ect Mult Living   3    2          Past Medical History  Diagnosis Date  . Medical history non-contributory    Past Surgical History  Procedure Laterality Date  . No past surgeries     Family History: family history includes Cancer in her maternal aunt and maternal grandmother. Social History:  reports that she has been smoking Cigarettes.  She has a 1 pack-year smoking history. She has never used smokeless tobacco. She reports that she does not drink alcohol or use illicit drugs.   Prenatal Transfer Tool   Significant Maternal Medications:  Meds include: Nexium  Review of Systems  Constitutional: Negative for fever.  Eyes: Negative for blurred vision.  Respiratory: Negative for shortness of breath.   Gastrointestinal: Negative for vomiting.  Skin: Negative for rash.  Neurological: Negative for headaches.    Dilation: 4 Effacement (%): 90 Station: 0 Exam by:: Dellie Burns, RN BSN Blood pressure 116/68, pulse 95, temperature 98.8 F (37.1 C), temperature source Oral, resp. rate 20, height 5\' 3"  (1.6 m), weight 74.021 kg (163 lb 3 oz), last menstrual period 02/28/2012. Maternal Exam:  Abdomen: not evaluated.  Introitus: not evaluated.   Pelvis: adequate for delivery.   Cervix: Cervix evaluated by digital exam.     Fetal Exam Fetal Monitor Review: Variability: moderate (6-25 bpm).   Pattern: accelerations present and no decelerations.    Fetal State Assessment: Category I - tracings are normal.     Physical Exam  Constitutional: She appears  well-developed.  HENT:  Head: Normocephalic.  Neck: Neck supple. No thyromegaly present.  Cardiovascular: Normal rate and regular rhythm.   Respiratory: Breath sounds normal.  GI: Soft. Bowel sounds are normal.  Skin: No rash noted.    Prenatal labs: ABO, Rh: A/POS/-- (07/24 1345) Antibody: NEG (07/24 1345) Rubella: 1.45 (07/24 1345) RPR: NON REAC (08/05 1205)  HBsAg: NEGATIVE (07/24 1345)  HIV: NON REACTIVE (08/05 1205)  GBS: NEGATIVE (11/04 1351)   Assessment/Plan: Nullipara at term, active labor, Category 1 FHT. Admit, anticipate an NSVD   JACKSON-MOORE,Norberto Wishon A 12/23/2012, 12:41 PM

## 2012-12-23 NOTE — Progress Notes (Signed)
Dr Tamela Oddi notified of pts VE, contractions, FHR pattern, Admit orders received

## 2012-12-23 NOTE — MAU Note (Signed)
Patient presents with complaint of more intense contractions since 0300 today.

## 2012-12-24 ENCOUNTER — Encounter (HOSPITAL_COMMUNITY): Payer: Self-pay | Admitting: *Deleted

## 2012-12-24 ENCOUNTER — Encounter: Payer: Medicaid Other | Admitting: Obstetrics

## 2012-12-24 MED ORDER — ONDANSETRON HCL 4 MG PO TABS: 4.0000 mg | ORAL_TABLET | ORAL | Status: DC | PRN

## 2012-12-24 MED ORDER — ZOLPIDEM TARTRATE 5 MG PO TABS: 5.0000 mg | ORAL_TABLET | Freq: Every evening | ORAL | Status: DC | PRN

## 2012-12-24 MED ORDER — ONDANSETRON HCL 4 MG/2ML IJ SOLN: 4.0000 mg | INTRAMUSCULAR | Status: DC | PRN

## 2012-12-24 MED ORDER — METHYLERGONOVINE MALEATE 0.2 MG/ML IJ SOLN
INTRAMUSCULAR | Status: AC
  Administered 2012-12-24: 0.2 mg
  Filled 2012-12-24: qty 1

## 2012-12-24 MED ORDER — BUPIVACAINE HCL (PF) 0.25 % IJ SOLN
INTRAMUSCULAR | Status: DC | PRN
  Administered 2012-12-24: 10 mL via EPIDURAL

## 2012-12-24 MED ORDER — LANOLIN HYDROUS EX OINT: TOPICAL_OINTMENT | CUTANEOUS | Status: DC | PRN

## 2012-12-24 MED ORDER — SENNOSIDES-DOCUSATE SODIUM 8.6-50 MG PO TABS
2.0000 | ORAL_TABLET | ORAL | Status: DC
  Administered 2012-12-25 – 2012-12-26 (×2): 2 via ORAL
  Filled 2012-12-24 (×2): qty 2

## 2012-12-24 MED ORDER — SIMETHICONE 80 MG PO CHEW: 80.0000 mg | CHEWABLE_TABLET | ORAL | Status: DC | PRN

## 2012-12-24 MED ORDER — METHYLERGONOVINE MALEATE 0.2 MG/ML IJ SOLN: 0.2000 mg | Freq: Four times a day (QID) | INTRAMUSCULAR | Status: AC

## 2012-12-24 MED ORDER — BENZOCAINE-MENTHOL 20-0.5 % EX AERO
1.0000 "application " | INHALATION_SPRAY | CUTANEOUS | Status: DC | PRN
  Filled 2012-12-24: qty 56

## 2012-12-24 MED ORDER — DIPHENHYDRAMINE HCL 25 MG PO CAPS: 25.0000 mg | ORAL_CAPSULE | Freq: Four times a day (QID) | ORAL | Status: DC | PRN

## 2012-12-24 MED ORDER — DIBUCAINE 1 % RE OINT: 1.0000 "application " | TOPICAL_OINTMENT | RECTAL | Status: DC | PRN

## 2012-12-24 MED ORDER — PRENATAL MULTIVITAMIN CH
1.0000 | ORAL_TABLET | Freq: Every day | ORAL | Status: DC
  Administered 2012-12-25 – 2012-12-26 (×2): 1 via ORAL
  Filled 2012-12-24 (×2): qty 1

## 2012-12-24 MED ORDER — METHYLERGONOVINE MALEATE 0.2 MG PO TABS
0.2000 mg | ORAL_TABLET | Freq: Four times a day (QID) | ORAL | Status: AC
  Administered 2012-12-24 – 2012-12-25 (×5): 0.2 mg via ORAL
  Filled 2012-12-24 (×5): qty 1

## 2012-12-24 MED ORDER — MEDROXYPROGESTERONE ACETATE 150 MG/ML IM SUSP: 150.0000 mg | INTRAMUSCULAR | Status: DC | PRN

## 2012-12-24 MED ORDER — IBUPROFEN 600 MG PO TABS
600.0000 mg | ORAL_TABLET | Freq: Four times a day (QID) | ORAL | Status: DC
  Administered 2012-12-25 – 2012-12-26 (×8): 600 mg via ORAL
  Filled 2012-12-24 (×8): qty 1

## 2012-12-24 MED ORDER — WITCH HAZEL-GLYCERIN EX PADS: 1.0000 "application " | MEDICATED_PAD | CUTANEOUS | Status: DC | PRN

## 2012-12-24 MED ORDER — OXYCODONE-ACETAMINOPHEN 5-325 MG PO TABS: 1.0000 | ORAL_TABLET | ORAL | Status: DC | PRN

## 2012-12-24 MED ORDER — TETANUS-DIPHTH-ACELL PERTUSSIS 5-2.5-18.5 LF-MCG/0.5 IM SUSP: 0.5000 mL | Freq: Once | INTRAMUSCULAR | Status: DC

## 2012-12-24 MED ORDER — OXYTOCIN 40 UNITS IN LACTATED RINGERS INFUSION - SIMPLE MED
62.5000 mL/h | INTRAVENOUS | Status: DC | PRN
  Filled 2012-12-24: qty 1000

## 2012-12-24 NOTE — Progress Notes (Signed)
Maria Compton is a 27 y.o. G3P0020 at [redacted]w[redacted]d by LMP admitted for active labor  Subjective:   Objective: BP 120/76  Pulse 85  Temp(Src) 98 F (36.7 C) (Axillary)  Resp 18  Ht 5\' 3"  (1.6 m)  Wt 163 lb 3 oz (74.021 kg)  BMI 28.91 kg/m2  SpO2 92%  LMP 02/28/2012 I/O last 3 completed shifts: In: -  Out: 550 [Urine:550]    FHT:  FHR: 130-140 bpm, variability: moderate,  accelerations:  Present,  decelerations:  Absent UC:   regular, every 2-3 minutes SVE:   Dilation: Lip/rim Effacement (%): 90 (thick ant lip) Station: +2 Exam by:: Lorretta Harp RNC  Labs: Lab Results  Component Value Date   WBC 24.6* 12/23/2012   HGB 13.1 12/23/2012   HCT 36.3 12/23/2012   MCV 83.6 12/23/2012   PLT 304 12/23/2012    Assessment / Plan: Spontaneous labor, progressing normally  Labor: Progressing normally Preeclampsia:  n/a Fetal Wellbeing:  Category I Pain Control:  Epidural I/D:  n/a Anticipated MOD:  NSVD  Maria Compton A 12/24/2012, 11:39 AM

## 2012-12-24 NOTE — Progress Notes (Signed)
Maria Compton is a 27 y.o. G3P0020 at [redacted]w[redacted]d by LMP admitted for active labor  Subjective:   Objective: BP 123/86  Pulse 90  Temp(Src) 98.2 F (36.8 C) (Axillary)  Resp 18  Ht 5\' 3"  (1.6 m)  Wt 163 lb 3 oz (74.021 kg)  BMI 28.91 kg/m2  SpO2 92%  LMP 02/28/2012 I/O last 3 completed shifts: In: -  Out: 550 [Urine:550]    FHT:  FHR: 130 bpm, variability: moderate,  accelerations:  Present,  decelerations:  Absent UC:   regular, every 2-3 minutes SVE:   Dilation: 9 (thick anterior lip) Effacement (%): 90 Station: 0;+1 (caput) Exam by:: Lorretta Harp RNC  Labs: Lab Results  Component Value Date   WBC 24.6* 12/23/2012   HGB 13.1 12/23/2012   HCT 36.3 12/23/2012   MCV 83.6 12/23/2012   PLT 304 12/23/2012    Assessment / Plan: Protracted active phase Minimal progress for last 8 hours with adequate contractile forces of 200+ MVU's.  Probably OP.   Monitor closely.  Position changes for OP.  Labor: Slow progress. Preeclampsia:  n/a Fetal Wellbeing:  Category I Pain Control:  Epidural I/D:  n/a Anticipated MOD:  C/S  Stevee Valenta A 12/24/2012, 9:05 AM

## 2012-12-24 NOTE — Progress Notes (Signed)
Maria Compton is a 27 y.o. G3P0020 at [redacted]w[redacted]d by LMP admitted for active labor  Subjective:   Objective: BP 123/76  Pulse 107  Temp(Src) 99.5 F (37.5 C) (Oral)  Resp 22  Ht 5\' 3"  (1.6 m)  Wt 163 lb 3 oz (74.021 kg)  BMI 28.91 kg/m2  SpO2 92%  LMP 02/28/2012 I/O last 3 completed shifts: In: -  Out: 550 [Urine:550]    FHT:  FHR: 130-140 bpm, variability: moderate,  accelerations:  Present,  decelerations:  Present Variable UC:   regular, every 2-3 minutes SVE:   Dilation: 10 Effacement (%): 90 Station: +1;+2 (caput +2) Exam by:: Lorretta Harp RNC  Labs: Lab Results  Component Value Date   WBC 24.6* 12/23/2012   HGB 13.1 12/23/2012   HCT 36.3 12/23/2012   MCV 83.6 12/23/2012   PLT 304 12/23/2012    Assessment / Plan: Augmentation of labor, progressing well  Labor: Progressing normally Preeclampsia:  n/a Fetal Wellbeing:  Category I Pain Control:  Epidural I/D:  n/a Anticipated MOD:  NSVD  HARPER,CHARLES A 12/24/2012, 5:01 PM

## 2012-12-25 LAB — CBC
Hemoglobin: 12.2 g/dL (ref 12.0–15.0)
MCH: 29.5 pg (ref 26.0–34.0)
MCHC: 35.2 g/dL (ref 30.0–36.0)
RBC: 4.13 MIL/uL (ref 3.87–5.11)
RDW: 13.6 % (ref 11.5–15.5)

## 2012-12-25 NOTE — Anesthesia Postprocedure Evaluation (Signed)
Anesthesia Post Note  Patient: Maria Compton  Procedure(s) Performed: * No procedures listed *  Anesthesia type: Epidural  Patient location: Mother/Baby  Post pain: Pain level controlled  Post assessment: Post-op Vital signs reviewed  Last Vitals:  Filed Vitals:   12/25/12 0330  BP:   Pulse:   Temp: 36.9 C  Resp:     Post vital signs: Reviewed  Level of consciousness:alert  Complications: No apparent anesthesia complications

## 2012-12-25 NOTE — Progress Notes (Signed)
Post Partum Day 1 Subjective: no complaints  Objective: Blood pressure 133/83, pulse 79, temperature 98.5 F (36.9 C), temperature source Oral, resp. rate 18, height 5\' 3"  (1.6 m), weight 163 lb 3 oz (74.021 kg), last menstrual period 02/28/2012, SpO2 92.00%, unknown if currently breastfeeding.  Physical Exam:  General: alert and no distress Lochia: appropriate Uterine Fundus: firm Incision: healing well DVT Evaluation: No evidence of DVT seen on physical exam.   Recent Labs  12/23/12 1210  HGB 13.1  HCT 36.3    Assessment/Plan: Plan for discharge tomorrow   LOS: 2 days   Dynisha Due A 12/25/2012, 6:16 AM

## 2012-12-25 NOTE — Progress Notes (Signed)
UR completed 

## 2012-12-25 NOTE — Lactation Note (Signed)
This note was copied from the chart of Boy Hunt Oris. Lactation Consultation Note Initial consultation with first-time mom; baby now 29 hours old. Mom holding baby at right breast, baby not interested in latching. Mom has good amount of colostrum easily expressed by hand expression. Mom has been hand expressing into spoon and feeding baby with spoon.  Enc mom to continue frequent STS and cue based feeding, and to continue attempting to latch baby to breast. Enc mom to continue to hand express and feed baby the colostrum that she expresses.  Mom's nipples are flat, breasts are compressible. A nipple shield might be beneficial if baby can't latch well. Discussed this option with mom and the benefit of waiting a few more hours to see if baby becomes more alert and can latch well without the shield; mom agrees. Lactation brochure provided, reviewed br feeding basics in baby and me book, enc mom to call if she has any questions or concerns.    Patient Name: Boy Conya Ellinwood [id number] Date: 12/25/2012 Reason for consult: Initial assessment   Maternal Data Formula Feeding for Exclusion: No Has patient been taught Hand Expression?: Yes Does the patient have breastfeeding experience prior to this delivery?: No  Feeding Feeding Type: Breast Fed Length of feed: 0 min  LATCH Score/Interventions Latch: Too sleepy or reluctant, no latch achieved, no sucking elicited.  Audible Swallowing: None  Type of Nipple: Flat Intervention(s): Hand pump  Comfort (Breast/Nipple): Soft / non-tender     Hold (Positioning): No assistance needed to correctly position infant at breast.  LATCH Score: 5  Lactation Tools Discussed/Used Tools:  (spoon)   Consult Status Consult Status: Follow-up Follow-up type: In-patient    Octavio Manns [facility] 12/25/2012, 12:40 PM

## 2012-12-25 NOTE — Lactation Note (Signed)
This note was copied from the chart of Maria Compton. Lactation Consultation Note  Patient Name: Maria Kyriaki Moder [id number] Date: 12/25/2012 Reason for consult: Follow-up assessment;Difficult latch and sleepy baby less than 24 hours of life.  Mom states she recently attempted to latch and expressed some colostrum into his mouth, but at this time, baby is STS with FOB and mom declined offer for assistance.  LC discussed plan and possible solutions for latch difficulty with RN, Tana Coast.  LC provided RN with curved-tip syringe and sizes #20 and #24 NS for her to try at later feeding, if needed.  LC reviewed cue feedings, small newborn stomach size and rapid digestion of breast milk leading to frequent feedings.   Maternal Data    Feeding Feeding Type: Breast Fed  LATCH Score/Interventions Latch: Repeated attempts needed to sustain latch, nipple held in mouth throughout feeding, stimulation needed to elicit sucking reflex. Intervention(s): Adjust position;Breast massage;Breast compression  Audible Swallowing: None Intervention(s): Skin to skin;Hand expression  Type of Nipple: Flat Intervention(s): Shells;Hand pump  Comfort (Breast/Nipple): Soft / non-tender     Hold (Positioning): Assistance needed to correctly position infant at breast and maintain latch. Intervention(s): Breastfeeding basics reviewed;Support Pillows;Position options;Skin to skin  LATCH Score: 5 (previous attempt with RN assessment)  Lactation Tools Discussed/Used Tools: Nipple Shields Nipple shield size: 20;24;Other (comment) (mom declines assistance for now; RN will assist later) Mom already has shells and hand pump for semi-flat nipples, per RN and LC assessment earlier today  Consult Status Consult Status: Follow-up Date: 12/26/12 Follow-up type: In-patient    Warrick Parisian [facility] 12/25/2012, 7:05 PM

## 2012-12-26 LAB — GC/CHLAMYDIA PROBE AMP
CT Probe RNA: NEGATIVE
GC Probe RNA: NEGATIVE

## 2012-12-26 MED ORDER — IBUPROFEN 600 MG PO TABS: 600.0000 mg | ORAL_TABLET | Freq: Four times a day (QID) | ORAL | Status: DC | PRN

## 2012-12-26 MED ORDER — OXYCODONE-ACETAMINOPHEN 5-325 MG PO TABS: 1.0000 | ORAL_TABLET | ORAL | Status: DC | PRN

## 2012-12-26 NOTE — Discharge Summary (Signed)
Obstetric Discharge Summary Reason for Admission: onset of labor Prenatal Procedures: ultrasound Intrapartum Procedures: spontaneous vaginal delivery Postpartum Procedures: none Complications-Operative and Postpartum: none Hemoglobin  Date Value Range Status  12/25/2012 12.2  12.0 - 15.0 g/dL Final     HCT  Date Value Range Status  12/25/2012 34.7* 36.0 - 46.0 % Final    Physical Exam:  General: alert and no distress Lochia: appropriate Uterine Fundus: firm Incision: healing well DVT Evaluation: No evidence of DVT seen on physical exam.  Discharge Diagnoses: Term Pregnancy-delivered  Discharge Information: Date: 12/26/2012 Activity: pelvic rest Diet: routine Medications: PNV, Ibuprofen, Colace and Percocet Condition: stable Instructions: refer to practice specific booklet Discharge to: home Follow-up Information   Schedule an appointment as soon as possible for a visit with HARPER,CHARLES A, MD.   Specialty:  Obstetrics and Gynecology   Contact information:   [address] [place] [address] [phone]       Newborn Data: Live born female  Birth Weight: 7 lb 13.2 oz (3549 g) APGAR: 7, 9  Home with mother.  HARPER,CHARLES A 12/26/2012, 5:46 AM

## 2012-12-26 NOTE — Progress Notes (Signed)
Post Partum Day 2 Subjective: no complaints  Objective: Blood pressure 132/85, pulse 67, temperature 97.6 F (36.4 C), temperature source Oral, resp. rate 16, height 5\' 3"  (1.6 m), weight 163 lb 3 oz (74.021 kg), last menstrual period 02/28/2012, SpO2 92.00%, unknown if currently breastfeeding.  Physical Exam:  General: alert and no distress Lochia: appropriate Uterine Fundus: firm Incision: healing well DVT Evaluation: No evidence of DVT seen on physical exam.   Recent Labs  12/23/12 1210 12/25/12 0618  HGB 13.1 12.2  HCT 36.3 34.7*    Assessment/Plan: Discharge home   LOS: 3 days   HARPER,CHARLES A 12/26/2012, 5:43 AM

## 2013-01-10 NOTE — L&D Delivery Note (Signed)
Delivery Note At 8:28 PM a viable female was delivered via Vaginal, Spontaneous Delivery (Presentation: ;  ).  APGAR: 8, 9; weight: 3360 grams  .   Placenta status: Intact, Spontaneous.  Cord: 3 vessels with the following complications: None.  Cord pH: none  Anesthesia: Epidural  Episiotomy: None Lacerations: Vaginal Suture Repair: 2.0 chromic Est. Blood Loss (mL):  350  Mom to postpartum.  Baby to Couplet care / Skin to Skin.  Maria Compton A 11/23/2013, 9:55 PM

## 2013-02-08 ENCOUNTER — Emergency Department (HOSPITAL_COMMUNITY): Payer: Medicaid Other

## 2013-02-08 ENCOUNTER — Emergency Department (HOSPITAL_COMMUNITY)
Admission: EM | Admit: 2013-02-08 | Discharge: 2013-02-08 | Disposition: A | Discharge status: Home / Self Care | Payer: Medicaid Other | Attending: Emergency Medicine | Admitting: Emergency Medicine

## 2013-02-08 ENCOUNTER — Encounter (HOSPITAL_COMMUNITY): Payer: Self-pay | Admitting: Emergency Medicine

## 2013-02-08 DIAGNOSIS — Y92410 Unspecified street and highway as the place of occurrence of the external cause: Secondary | ICD-10-CM | POA: Insufficient documentation

## 2013-02-08 DIAGNOSIS — Y9389 Activity, other specified: Secondary | ICD-10-CM | POA: Insufficient documentation

## 2013-02-08 DIAGNOSIS — M542 Cervicalgia: Secondary | ICD-10-CM

## 2013-02-08 DIAGNOSIS — S0993XA Unspecified injury of face, initial encounter: Secondary | ICD-10-CM | POA: Diagnosis present

## 2013-02-08 DIAGNOSIS — F17200 Nicotine dependence, unspecified, uncomplicated: Secondary | ICD-10-CM | POA: Insufficient documentation

## 2013-02-08 DIAGNOSIS — S199XXA Unspecified injury of neck, initial encounter: Principal | ICD-10-CM

## 2013-02-08 MED ORDER — IBUPROFEN 600 MG PO TABS: 600.0000 mg | ORAL_TABLET | Freq: Four times a day (QID) | ORAL | Status: DC | PRN
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
## 2013-02-08 NOTE — Discharge Instructions (Signed)
Take the prescribed medication as directed. May wish to apply heat to back of neck to help with pain and soreness. Return to the ED for new or worsening symptoms.
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
## 2013-02-10 NOTE — ED Provider Notes (Signed)
Medical screening examination/treatment/procedure(s) were performed by non-physician practitioner and as supervising physician I was immediately available for consultation/collaboration.    Amy Gothard R Cloria Ciresi, MD 02/10/13 1217 

## 2013-02-18 ENCOUNTER — Ambulatory Visit: Payer: Medicaid Other | Admitting: Obstetrics

## 2013-06-10 ENCOUNTER — Ambulatory Visit (INDEPENDENT_AMBULATORY_CARE_PROVIDER_SITE_OTHER): Payer: Medicaid Other | Admitting: Obstetrics

## 2013-06-10 ENCOUNTER — Encounter: Payer: Self-pay | Admitting: Obstetrics

## 2013-06-10 VITALS — BP 124/72 | HR 90 | Temp 97.3°F | Wt 131.0 lb

## 2013-06-10 DIAGNOSIS — Z3480 Encounter for supervision of other normal pregnancy, unspecified trimester: Secondary | ICD-10-CM

## 2013-06-10 DIAGNOSIS — Z1389 Encounter for screening for other disorder: Secondary | ICD-10-CM

## 2013-06-10 LAB — POCT URINALYSIS DIPSTICK
Blood, UA: NEGATIVE
Glucose, UA: NEGATIVE
KETONES UA: NEGATIVE
Leukocytes, UA: NEGATIVE
Nitrite, UA: NEGATIVE
Protein, UA: NEGATIVE
Spec Grav, UA: 1.015
pH, UA: 7

## 2013-06-10 LAB — OB RESULTS CONSOLE GC/CHLAMYDIA
Chlamydia: NEGATIVE
Gonorrhea: NEGATIVE

## 2013-06-10 MED ORDER — CITRANATAL HARMONY 27-1-260 MG PO CAPS: 1.0000 | ORAL_CAPSULE | Freq: Every day | ORAL | Status: DC

## 2013-06-10 NOTE — Progress Notes (Signed)
Subjective:    Maria Compton is being seen today for her first obstetrical visit.  This is a planned pregnancy. She is at 3915w3d gestation. Her obstetrical history is significant for none. Relationship with FOB: significant other, living together. Patient does intend to breast feed. Pregnancy history fully reviewed.  The information documented in the HPI was reviewed and verified.  Menstrual History: OB History   Grav Para Term Preterm Abortions TAB SAB Ect Mult Living   4 1 1  2     1        Patient's last menstrual period was 02/08/2013.    Past Medical History  Diagnosis Date  . Medical history non-contributory     Past Surgical History  Procedure Laterality Date  . No past surgeries       (Not in a hospital admission) No Known Allergies  History  Substance Use Topics  . Smoking status: Former Smoker -- 0.25 packs/day for 4 years    Types: Cigarettes    Quit date: 05/27/2013  . Smokeless tobacco: Never Used  . Alcohol Use: No    Family History  Problem Relation Age of Onset  . Cancer Maternal Aunt   . Cancer Maternal Grandmother      Review of Systems Constitutional: negative for weight loss Gastrointestinal: negative for vomiting Genitourinary:negative for genital lesions and vaginal discharge and dysuria Musculoskeletal:negative for back pain Behavioral/Psych: negative for abusive relationship, depression, illegal drug usage and tobacco use    Objective:     General Appearance:    Alert, cooperative, no distress, appears stated age  Head:    Normocephalic, without obvious abnormality, atraumatic  Eyes:    PERRL, conjunctiva/corneas clear, EOM's intact, fundi    benign, both eyes  Ears:    Normal TM's and external ear canals, both ears  Nose:   Nares normal, septum midline, mucosa normal, no drainage    or sinus tenderness  Throat:   Lips, mucosa, and tongue normal; teeth and gums normal  Neck:   Supple, symmetrical, trachea midline, no adenopathy;   thyroid:  no enlargement/tenderness/nodules; no carotid   bruit or JVD  Back:     Symmetric, no curvature, ROM normal, no CVA tenderness  Lungs:     Clear to auscultation bilaterally, respirations unlabored  Chest Wall:    No tenderness or deformity   Heart:    Regular rate and rhythm, S1 and S2 normal, no murmur, rub   or gallop  Breast Exam:    No tenderness, masses, or nipple abnormality  Abdomen:     Uterus 17 weeks, soft, NT.  Genitalia:    Normal female without lesion, discharge or tenderness  Extremities:   Extremities normal, atraumatic, no cyanosis or edema  Pulses:   2+ and symmetric all extremities  Skin:   Skin color, texture, turgor normal, no rashes or lesions  Lymph nodes:   Cervical, supraclavicular, and axillary nodes normal  Neurologic:   CNII-XII intact, normal strength, sensation and reflexes    throughout      Lab Review Urine pregnancy test Labs reviewed yes Radiologic studies reviewed no Assessment:    Pregnancy at 8315w3d weeks    Plan:      Prenatal vitamins.  Counseling provided regarding continued use of seat belts, cessation of alcohol consumption, smoking or use of illicit drugs; infection precautions i.e., influenza/TDAP immunizations, toxoplasmosis,CMV, parvovirus, listeria and varicella; workplace safety, exercise during pregnancy; routine dental care, safe medications, sexual activity, hot tubs, saunas, pools, travel, caffeine use, fish  and methlymercury, potential toxins, hair treatments, varicose veins Weight gain recommendations per IOM guidelines reviewed: underweight/BMI< 18.5--> gain 28 - 40 lbs; normal weight/BMI 18.5 - 24.9--> gain 25 - 35 lbs; overweight/BMI 25 - 29.9--> gain 15 - 25 lbs; obese/BMI >30->gain  11 - 20 lbs Problem list reviewed and updated. FIRST/CF mutation testing/NIPT/QUAD SCREEN/fragile X/Ashkenazi Jewish population testing/Spinal muscular atrophy discussed: requested. Role of ultrasound in pregnancy discussed; fetal survey:  requested. Amniocentesis discussed: not indicated. VBAC calculator score: VBAC consent form provided Meds ordered this encounter  Medications  . Prenat-FeFmCb-DSS-FA-DHA w/o A (CITRANATAL HARMONY) 27-1-260 MG CAPS    Sig: Take 1 capsule by mouth daily before breakfast.    Dispense:  90 capsule    Refill:  3   Orders Placed This Encounter  Procedures  . Culture, OB Urine  . WET PREP BY MOLECULAR PROBE  . GC/Chlamydia Probe Amp  . US OB Comp + 14 Wk    Standing Status: Future     Number of Occurrences:      Standing Expiration Date: 08/11/2014    Order Specific Question:  Reason for Exam (SYMPTOM  OR DIAGNOSIS REQUIRED)    Answer:  anatomic survey    Order Specific Question:  Preferred imaging location?    Answer:  [facility]  . Obstetric panel  . HIV antibody  . Hemoglobinopathy evaluation  . Varicella zoster antibody, IgG  . Vit D  25 hydroxy (rtn osteoporosis monitoring)  . AFP, Quad Screen    Order Specific Question:  Repeat Sample    Answer:  No    Order Specific Question:  Maternal Race    Answer:  caucasian    Order Specific Question:  EDD    Answer:  11/15/2013    Order Specific Question:  Brief History NTD    Answer:  no    Order Specific Question:  Pregnancy Donor Egg (Y/N)    Answer:  No    Order Specific Question:  Gest Age at U/S (Wk.Dy)    Answer:  n/a    Order Specific Question:  LMP:    Answer:  02/08/2013    Order Specific Question:  Number of Fetuses    Answer:  1    Order Specific Question:  Hx of OSB/NTD?    Answer:  No    Order Specific Question:  History of Down Syndrome?    Answer:  No    Order Specific Question:  Maternal IDDM (insulin-dependent diabetes mellitus)    Answer:  No    Order Specific Question:  Maternal Weight (lbs)    Answer:  131  . POCT urinalysis dipstick    Follow up in 4 weeks.

## 2013-06-11 LAB — OBSTETRIC PANEL
ANTIBODY SCREEN: NEGATIVE
BASOS ABS: 0 10*3/uL (ref 0.0–0.1)
Basophils Relative: 0 % (ref 0–1)
EOS PCT: 4 % (ref 0–5)
Eosinophils Absolute: 0.6 10*3/uL (ref 0.0–0.7)
HCT: 36.4 % (ref 36.0–46.0)
Hemoglobin: 12.7 g/dL (ref 12.0–15.0)
Hepatitis B Surface Ag: NEGATIVE
LYMPHS ABS: 1.7 10*3/uL (ref 0.7–4.0)
LYMPHS PCT: 12 % (ref 12–46)
MCH: 29.7 pg (ref 26.0–34.0)
MCHC: 34.9 g/dL (ref 30.0–36.0)
MCV: 85.2 fL (ref 78.0–100.0)
MONO ABS: 0.7 10*3/uL (ref 0.1–1.0)
Monocytes Relative: 5 % (ref 3–12)
Neutro Abs: 11.1 10*3/uL — ABNORMAL HIGH (ref 1.7–7.7)
Neutrophils Relative %: 79 % — ABNORMAL HIGH (ref 43–77)
Platelets: 301 10*3/uL (ref 150–400)
RBC: 4.27 MIL/uL (ref 3.87–5.11)
RDW: 14.5 % (ref 11.5–15.5)
Rh Type: POSITIVE
Rubella: 1.37 Index — ABNORMAL HIGH (ref <0.90)
WBC: 14 10*3/uL — AB (ref 4.0–10.5)

## 2013-06-11 LAB — CULTURE, OB URINE
COLONY COUNT: NO GROWTH
Organism ID, Bacteria: NO GROWTH

## 2013-06-11 LAB — GC/CHLAMYDIA PROBE AMP
CT Probe RNA: NEGATIVE
GC Probe RNA: NEGATIVE

## 2013-06-11 LAB — VARICELLA ZOSTER ANTIBODY, IGG: Varicella IgG: 1248 Index — ABNORMAL HIGH (ref <135.00)

## 2013-06-11 LAB — WET PREP BY MOLECULAR PROBE
CANDIDA SPECIES: NEGATIVE
Gardnerella vaginalis: POSITIVE — AB
TRICHOMONAS VAG: NEGATIVE

## 2013-06-11 LAB — VITAMIN D 25 HYDROXY (VIT D DEFICIENCY, FRACTURES): VIT D 25 HYDROXY: 42 ng/mL (ref 30–89)

## 2013-06-11 LAB — HIV ANTIBODY (ROUTINE TESTING W REFLEX): HIV: NONREACTIVE

## 2013-06-12 LAB — AFP, QUAD SCREEN
AFP: 27.1 IU/mL
Age Alone: 1:948 {titer}
Curr Gest Age: 17.3 wks.days
HCG, Total: 13772 m[IU]/mL
INH: 221.5 pg/mL
Interpretation-AFP: NEGATIVE
MOM FOR INH: 1.07
MoM for AFP: 0.68
MoM for hCG: 0.65
Open Spina bifida: NEGATIVE
Tri 18 Scr Risk Est: NEGATIVE
uE3 Mom: 0.8
uE3 Value: 0.6 ng/mL

## 2013-06-12 LAB — HEMOGLOBINOPATHY EVALUATION
Hemoglobin Other: 0 %
Hgb A2 Quant: 2.7 % (ref 2.2–3.2)
Hgb A: 97.3 % (ref 96.8–97.8)
Hgb F Quant: 0 % (ref 0.0–2.0)
Hgb S Quant: 0 %

## 2013-06-14 ENCOUNTER — Encounter: Payer: Self-pay | Admitting: *Deleted

## 2013-06-14 ENCOUNTER — Other Ambulatory Visit: Payer: Self-pay | Admitting: *Deleted

## 2013-06-14 DIAGNOSIS — B9689 Other specified bacterial agents as the cause of diseases classified elsewhere: Secondary | ICD-10-CM

## 2013-06-14 DIAGNOSIS — N760 Acute vaginitis: Principal | ICD-10-CM

## 2013-06-14 MED ORDER — METRONIDAZOLE 500 MG PO TABS: 500.0000 mg | ORAL_TABLET | Freq: Two times a day (BID) | ORAL | Status: DC

## 2013-06-19 ENCOUNTER — Other Ambulatory Visit: Payer: Self-pay | Admitting: Obstetrics

## 2013-06-19 DIAGNOSIS — Z1389 Encounter for screening for other disorder: Secondary | ICD-10-CM

## 2013-07-03 ENCOUNTER — Ambulatory Visit (INDEPENDENT_AMBULATORY_CARE_PROVIDER_SITE_OTHER): Payer: Medicaid Other

## 2013-07-03 DIAGNOSIS — Z1389 Encounter for screening for other disorder: Secondary | ICD-10-CM

## 2013-07-03 LAB — US OB COMP + 14 WK

## 2013-07-09 ENCOUNTER — Ambulatory Visit (INDEPENDENT_AMBULATORY_CARE_PROVIDER_SITE_OTHER): Payer: Medicaid Other | Admitting: Obstetrics

## 2013-07-09 VITALS — BP 122/75 | HR 71 | Temp 97.8°F | Wt 135.0 lb

## 2013-07-09 DIAGNOSIS — Z3480 Encounter for supervision of other normal pregnancy, unspecified trimester: Secondary | ICD-10-CM

## 2013-07-09 DIAGNOSIS — Z3482 Encounter for supervision of other normal pregnancy, second trimester: Secondary | ICD-10-CM

## 2013-07-09 NOTE — Progress Notes (Signed)
Patient needs to review her dating.

## 2013-07-10 ENCOUNTER — Encounter: Payer: Self-pay | Admitting: Obstetrics

## 2013-07-10 NOTE — Progress Notes (Signed)
Subjective:    Maria Compton is a 28 y.o. female being seen today for her obstetrical visit. She is at 8536w5d gestation. Patient reports: no complaints . Fetal movement: normal.  Problem List Items Addressed This Visit   None    Visit Diagnoses   Encounter for supervision of other normal pregnancy in second trimester    -  Primary    Relevant Orders       POCT urinalysis dipstick      Patient Active Problem List   Diagnosis Date Noted  . Normal delivery 12/24/2012  . GERD without esophagitis 09/18/2012   Objective:    BP 122/75  Pulse 71  Temp(Src) 97.8 F (36.6 C)  Wt 135 lb (61.236 kg)  LMP 02/08/2013 FHT: 150 BPM  Uterine Size: size equals dates     Assessment:    Pregnancy @ 636w5d    Plan:    OBGCT: ordered for next visit.  Labs, problem list reviewed and updated 2 hr GTT planned Follow up in 4 weeks.

## 2013-07-15 LAB — POCT URINALYSIS DIPSTICK
Bilirubin, UA: NEGATIVE
Blood, UA: NEGATIVE
Glucose, UA: NEGATIVE
KETONES UA: NEGATIVE
Leukocytes, UA: NEGATIVE
Nitrite, UA: NEGATIVE
PH UA: 5
Protein, UA: NEGATIVE
Spec Grav, UA: <=1.005
Urobilinogen, UA: NEGATIVE

## 2013-08-06 ENCOUNTER — Encounter: Payer: Self-pay | Admitting: Obstetrics

## 2013-08-06 ENCOUNTER — Other Ambulatory Visit: Payer: Medicaid Other

## 2013-08-06 ENCOUNTER — Ambulatory Visit (INDEPENDENT_AMBULATORY_CARE_PROVIDER_SITE_OTHER): Payer: Medicaid Other | Admitting: Obstetrics

## 2013-08-06 VITALS — BP 109/74 | HR 82 | Temp 97.5°F | Wt 140.0 lb

## 2013-08-06 DIAGNOSIS — Z3482 Encounter for supervision of other normal pregnancy, second trimester: Secondary | ICD-10-CM

## 2013-08-06 DIAGNOSIS — Z3480 Encounter for supervision of other normal pregnancy, unspecified trimester: Secondary | ICD-10-CM

## 2013-08-06 LAB — POCT URINALYSIS DIPSTICK
BILIRUBIN UA: NEGATIVE
GLUCOSE UA: NEGATIVE
Ketones, UA: NEGATIVE
LEUKOCYTES UA: NEGATIVE
Nitrite, UA: NEGATIVE
Protein, UA: NEGATIVE
RBC UA: NEGATIVE
Spec Grav, UA: 1.01
Urobilinogen, UA: NEGATIVE
pH, UA: 7

## 2013-08-06 LAB — CBC
HCT: 33.8 % — ABNORMAL LOW (ref 36.0–46.0)
Hemoglobin: 11.9 g/dL — ABNORMAL LOW (ref 12.0–15.0)
MCH: 30.3 pg (ref 26.0–34.0)
MCHC: 35.2 g/dL (ref 30.0–36.0)
MCV: 86 fL (ref 78.0–100.0)
Platelets: 287 10*3/uL (ref 150–400)
RBC: 3.93 MIL/uL (ref 3.87–5.11)
RDW: 14.4 % (ref 11.5–15.5)
WBC: 12.5 10*3/uL — AB (ref 4.0–10.5)

## 2013-08-06 NOTE — Progress Notes (Signed)
Subjective:    Maria Compton is a 28 y.o. female being seen today for her obstetrical visit. She is at 3327w4d gestation. Patient reports: no complaints . Fetal movement: normal.  Problem List Items Addressed This Visit   None    Visit Diagnoses   Encounter for supervision of other normal pregnancy in second trimester    -  Primary    Relevant Orders       POCT urinalysis dipstick (Completed)       Glucose Tolerance, 2 Hours w/1 Hour       HIV antibody       RPR       CBC      Patient Active Problem List   Diagnosis Date Noted  . Normal delivery 12/24/2012  . GERD without esophagitis 09/18/2012   Objective:    BP 109/74  Pulse 82  Temp(Src) 97.5 F (36.4 C)  Wt 140 lb (63.504 kg)  LMP 02/08/2013 FHT: 140 BPM  Uterine Size: size equals dates     Assessment:    Pregnancy @ 1327w4d    Plan:    Signs and symptoms of preterm labor: discussed.  Labs, problem list reviewed and updated 2 hr GTT being done today Follow up in 2 weeks.

## 2013-08-07 LAB — GLUCOSE TOLERANCE, 2 HOURS W/ 1HR
GLUCOSE, 2 HOUR: 57 mg/dL — AB (ref 70–139)
Glucose, 1 hour: 73 mg/dL (ref 70–170)
Glucose, Fasting: 60 mg/dL — ABNORMAL LOW (ref 70–99)

## 2013-08-07 LAB — HIV ANTIBODY (ROUTINE TESTING W REFLEX): HIV 1&2 Ab, 4th Generation: NONREACTIVE

## 2013-08-07 LAB — RPR

## 2013-08-20 ENCOUNTER — Ambulatory Visit (INDEPENDENT_AMBULATORY_CARE_PROVIDER_SITE_OTHER): Payer: Medicaid Other | Admitting: Obstetrics

## 2013-08-20 ENCOUNTER — Encounter: Payer: Self-pay | Admitting: Obstetrics

## 2013-08-20 VITALS — BP 120/70 | HR 92 | Temp 97.3°F | Wt 144.0 lb

## 2013-08-20 DIAGNOSIS — K219 Gastro-esophageal reflux disease without esophagitis: Secondary | ICD-10-CM

## 2013-08-20 DIAGNOSIS — Z3482 Encounter for supervision of other normal pregnancy, second trimester: Secondary | ICD-10-CM

## 2013-08-20 DIAGNOSIS — Z3480 Encounter for supervision of other normal pregnancy, unspecified trimester: Secondary | ICD-10-CM

## 2013-08-20 LAB — POCT URINALYSIS DIPSTICK
Bilirubin, UA: NEGATIVE
Glucose, UA: NEGATIVE
Ketones, UA: NEGATIVE
Leukocytes, UA: NEGATIVE
NITRITE UA: NEGATIVE
Protein, UA: NEGATIVE
RBC UA: NEGATIVE
UROBILINOGEN UA: NEGATIVE
pH, UA: 7

## 2013-08-20 MED ORDER — OMEPRAZOLE 20 MG PO CPDR: 20.0000 mg | DELAYED_RELEASE_CAPSULE | Freq: Two times a day (BID) | ORAL | Status: DC

## 2013-08-20 NOTE — Progress Notes (Signed)
Subjective:    Maria Compton is a 28 y.o. female being seen today for her obstetrical visit. She is at 5469w4d gestation. Patient reports: heartburn . Fetal movement: normal.  Problem List Items Addressed This Visit   GERD without esophagitis   Relevant Medications      omeprazole (PRILOSEC) capsule    Other Visit Diagnoses   Encounter for supervision of other normal pregnancy in second trimester    -  Primary    Relevant Orders       POCT urinalysis dipstick      Patient Active Problem List   Diagnosis Date Noted  . Normal delivery 12/24/2012  . GERD without esophagitis 09/18/2012   Objective:    BP 120/70  Pulse 92  Temp(Src) 97.3 F (36.3 C)  Wt 144 lb (65.318 kg)  LMP 02/08/2013 FHT: 140 BPM  Uterine Size: size equals dates     Assessment:    Pregnancy @ 6069w4d    Plan:    OBGCT: discussed. Signs and symptoms of preterm labor: discussed. Smoking cessation discussed quit 5 / 2015.  Labs, problem list reviewed and updated 2 hr GTT planned Follow up in 2 weeks.

## 2013-09-03 ENCOUNTER — Ambulatory Visit (INDEPENDENT_AMBULATORY_CARE_PROVIDER_SITE_OTHER): Payer: Medicaid Other | Admitting: Obstetrics

## 2013-09-03 ENCOUNTER — Encounter: Payer: Self-pay | Admitting: Obstetrics

## 2013-09-03 ENCOUNTER — Telehealth: Payer: Self-pay

## 2013-09-03 ENCOUNTER — Encounter: Payer: Medicaid Other | Admitting: Obstetrics

## 2013-09-03 VITALS — BP 119/75 | HR 95 | Temp 97.5°F | Wt 145.0 lb

## 2013-09-03 DIAGNOSIS — O365921 Maternal care for other known or suspected poor fetal growth, second trimester, fetus 1: Secondary | ICD-10-CM

## 2013-09-03 DIAGNOSIS — Z3480 Encounter for supervision of other normal pregnancy, unspecified trimester: Secondary | ICD-10-CM

## 2013-09-03 DIAGNOSIS — O3090 Multiple gestation, unspecified, unspecified trimester: Secondary | ICD-10-CM

## 2013-09-03 DIAGNOSIS — O365990 Maternal care for other known or suspected poor fetal growth, unspecified trimester, not applicable or unspecified: Secondary | ICD-10-CM

## 2013-09-03 DIAGNOSIS — Z3483 Encounter for supervision of other normal pregnancy, third trimester: Secondary | ICD-10-CM

## 2013-09-03 LAB — POCT URINALYSIS DIPSTICK
Bilirubin, UA: NEGATIVE
Blood, UA: NEGATIVE
Glucose, UA: NEGATIVE
KETONES UA: NEGATIVE
Leukocytes, UA: NEGATIVE
Nitrite, UA: NEGATIVE
Protein, UA: NEGATIVE
SPEC GRAV UA: 1.01
Urobilinogen, UA: NEGATIVE
pH, UA: 7

## 2013-09-03 NOTE — Telephone Encounter (Signed)
told patient appt time for u/s at [facility] on 09/10/13

## 2013-09-03 NOTE — Telephone Encounter (Signed)
phone not taking messages at this time - tried to call to give patient u/s appt at [facility] on 09/10/13

## 2013-09-03 NOTE — Progress Notes (Signed)
Subjective:    Maria Compton is a 28 y.o. female being seen today for her obstetrical visit. She is at [redacted]w[redacted]d gestation. Patient reports no complaints. Fetal movement: normal.  Problem List Items Addressed This Visit   None    Visit Diagnoses   Encounter for supervision of other normal pregnancy in third trimester    -  Primary    Relevant Orders       POCT urinalysis dipstick (Completed)    Poor fetal growth, affecting management of mother, antepartum condition or complication, second trimester, fetus 1        Relevant Orders       US OB Follow Up      Patient Active Problem List   Diagnosis Date Noted  . Normal delivery 12/24/2012  . GERD without esophagitis 09/18/2012   Objective:    BP 119/75  Pulse 95  Temp(Src) 97.5 F (36.4 C)  Wt 145 lb (65.772 kg)  LMP 02/08/2013 FHT:  140 BPM  Uterine Size: size equals dates  Presentation: unsure     Assessment:    Pregnancy @ [redacted]w[redacted]d weeks   Plan:     labs reviewed, problem list updated Consent signed. GBS sent TDAP offered  Rhogam given for RH negative Pediatrician: discussed. Infant feeding: plans to breastfeed. Maternity leave: not discussed. Cigarette smoking: quit May 2015. Orders Placed This Encounter  Procedures  . US OB Follow Up    Standing Status: Future     Number of Occurrences:      Standing Expiration Date: 11/04/2014    Order Specific Question:  Reason for Exam (SYMPTOM  OR DIAGNOSIS REQUIRED)    Answer:  656.53    Order Specific Question:  Preferred imaging location?    Answer:  [facility]  . POCT urinalysis dipstick   No orders of the defined types were placed in this encounter.   Follow up in 2 Weeks.

## 2013-09-10 ENCOUNTER — Ambulatory Visit (HOSPITAL_COMMUNITY)
Admission: RE | Admit: 2013-09-10 | Discharge: 2013-09-10 | Disposition: A | Discharge status: Home / Self Care | Payer: Medicaid Other | Source: Ambulatory Visit | Attending: Obstetrics | Admitting: Obstetrics

## 2013-09-10 ENCOUNTER — Other Ambulatory Visit: Payer: Self-pay | Admitting: Obstetrics

## 2013-09-10 DIAGNOSIS — O365990 Maternal care for other known or suspected poor fetal growth, unspecified trimester, not applicable or unspecified: Secondary | ICD-10-CM | POA: Insufficient documentation

## 2013-09-10 DIAGNOSIS — Z1389 Encounter for screening for other disorder: Secondary | ICD-10-CM | POA: Diagnosis not present

## 2013-09-10 DIAGNOSIS — Z363 Encounter for antenatal screening for malformations: Secondary | ICD-10-CM | POA: Insufficient documentation

## 2013-09-10 DIAGNOSIS — O365921 Maternal care for other known or suspected poor fetal growth, second trimester, fetus 1: Secondary | ICD-10-CM

## 2013-09-17 ENCOUNTER — Encounter: Payer: Medicaid Other | Admitting: Obstetrics

## 2013-09-24 ENCOUNTER — Encounter: Payer: Self-pay | Admitting: Obstetrics

## 2013-09-24 ENCOUNTER — Ambulatory Visit (INDEPENDENT_AMBULATORY_CARE_PROVIDER_SITE_OTHER): Payer: Medicaid Other | Admitting: Obstetrics

## 2013-09-24 VITALS — BP 121/71 | HR 79 | Temp 97.2°F | Wt 146.0 lb

## 2013-09-24 DIAGNOSIS — Z3480 Encounter for supervision of other normal pregnancy, unspecified trimester: Secondary | ICD-10-CM

## 2013-09-24 DIAGNOSIS — Z3483 Encounter for supervision of other normal pregnancy, third trimester: Secondary | ICD-10-CM

## 2013-09-24 LAB — POCT URINALYSIS DIPSTICK
BILIRUBIN UA: NEGATIVE
Blood, UA: NEGATIVE
Glucose, UA: NEGATIVE
KETONES UA: NEGATIVE
LEUKOCYTES UA: NEGATIVE
Nitrite, UA: NEGATIVE
PH UA: 7
PROTEIN UA: NEGATIVE
SPEC GRAV UA: 1.01
Urobilinogen, UA: NEGATIVE

## 2013-09-24 NOTE — Progress Notes (Signed)
Subjective:    Maria Compton is a 28 y.o. female being seen today for her obstetrical visit. She is at [redacted]w[redacted]d gestation. Patient reports no complaints. Fetal movement: normal.  Problem List Items Addressed This Visit   None    Visit Diagnoses   Encounter for supervision of other normal pregnancy in third trimester    -  Primary    Relevant Orders       POCT urinalysis dipstick (Completed)      Patient Active Problem List   Diagnosis Date Noted  . Normal delivery 12/24/2012  . GERD without esophagitis 09/18/2012   Objective:    BP 121/71  Pulse 79  Temp(Src) 97.2 F (36.2 C)  Wt 146 lb (66.225 kg)  LMP 02/08/2013 FHT:  140 BPM  Uterine Size: size equals dates  Presentation: unsure     Assessment:    Pregnancy @ [redacted]w[redacted]d weeks   Plan:     labs reviewed, problem list updated Consent signed. GBS sent TDAP offered  Rhogam given for RH negative Pediatrician: discussed. Infant feeding: plans to breastfeed. Maternity leave: discussed. Cigarette smoking: quit May 2015. Orders Placed This Encounter  Procedures  . POCT urinalysis dipstick   No orders of the defined types were placed in this encounter.   Follow up in 2 Weeks.

## 2013-10-08 ENCOUNTER — Ambulatory Visit (INDEPENDENT_AMBULATORY_CARE_PROVIDER_SITE_OTHER): Payer: Medicaid Other | Admitting: Obstetrics

## 2013-10-08 ENCOUNTER — Encounter: Payer: Self-pay | Admitting: Obstetrics

## 2013-10-08 VITALS — BP 120/76 | HR 97 | Temp 97.3°F | Wt 149.0 lb

## 2013-10-08 DIAGNOSIS — Z3483 Encounter for supervision of other normal pregnancy, third trimester: Secondary | ICD-10-CM

## 2013-10-08 DIAGNOSIS — Z3480 Encounter for supervision of other normal pregnancy, unspecified trimester: Secondary | ICD-10-CM

## 2013-10-08 NOTE — Progress Notes (Signed)
Subjective:    Maria Compton is a 28 y.o. female being seen today for her obstetrical visit. She is at 5652w3d gestation. Patient reports no complaints. Fetal movement: normal.  Problem List Items Addressed This Visit   None    Visit Diagnoses   Encounter for supervision of other normal pregnancy in third trimester    -  Primary    Relevant Orders       POCT urinalysis dipstick      Patient Active Problem List   Diagnosis Date Noted  . Normal delivery 12/24/2012  . GERD without esophagitis 09/18/2012   Objective:    BP 120/76  Pulse 97  Temp(Src) 97.3 F (36.3 C)  Wt 149 lb (67.586 kg)  LMP 02/08/2013 FHT:  140 BPM  Uterine Size: size equals dates  Presentation: unsure     Assessment:    Pregnancy @ 4252w3d weeks   Plan:     labs reviewed, problem list updated Consent signed. GBS sent TDAP offered  Rhogam given for RH negative Pediatrician: discussed. Infant feeding: plans to breastfeed. Maternity leave: not discussed. Cigarette smoking: quit May 2015. Orders Placed This Encounter  Procedures  . POCT urinalysis dipstick   No orders of the defined types were placed in this encounter.   Follow up in 2 Weeks.

## 2013-10-09 LAB — POCT URINALYSIS DIPSTICK
Bilirubin, UA: NEGATIVE
Blood, UA: NEGATIVE
GLUCOSE UA: NEGATIVE
KETONES UA: NEGATIVE
LEUKOCYTES UA: NEGATIVE
Nitrite, UA: NEGATIVE
PH UA: 6
Spec Grav, UA: 1.02
Urobilinogen, UA: NEGATIVE

## 2013-10-22 ENCOUNTER — Ambulatory Visit (INDEPENDENT_AMBULATORY_CARE_PROVIDER_SITE_OTHER): Payer: Medicaid Other | Admitting: Obstetrics

## 2013-10-22 VITALS — BP 126/80 | HR 104 | Temp 98.1°F | Wt 149.0 lb

## 2013-10-22 DIAGNOSIS — Z3483 Encounter for supervision of other normal pregnancy, third trimester: Secondary | ICD-10-CM

## 2013-10-22 LAB — POCT URINALYSIS DIPSTICK
BILIRUBIN UA: NEGATIVE
GLUCOSE UA: NEGATIVE
Ketones, UA: NEGATIVE
LEUKOCYTES UA: NEGATIVE
Nitrite, UA: NEGATIVE
Protein, UA: NEGATIVE
RBC UA: NEGATIVE
Spec Grav, UA: 1.01
Urobilinogen, UA: NEGATIVE
pH, UA: 7

## 2013-10-24 ENCOUNTER — Encounter: Payer: Self-pay | Admitting: Obstetrics

## 2013-10-24 NOTE — Progress Notes (Signed)
Subjective:    Maria Compton is a 28 y.o. female being seen today for her obstetrical visit. She is at 6035w5d gestation. Patient reports backache. Fetal movement: normal.  Problem List Items Addressed This Visit   None    Visit Diagnoses   Encounter for supervision of other normal pregnancy in third trimester    -  Primary    Relevant Orders       POCT urinalysis dipstick (Completed)      Patient Active Problem List   Diagnosis Date Noted  . Normal delivery 12/24/2012  . GERD without esophagitis 09/18/2012   Objective:    BP 126/80  Pulse 104  Temp(Src) 98.1 F (36.7 C)  Wt 149 lb (67.586 kg)  LMP 02/08/2013 FHT:  140 BPM  Uterine Size: size equals dates  Presentation: unsure     Assessment:    Pregnancy @ 4035w5d weeks   Plan:     labs reviewed, problem list updated Consent signed. GBS sent TDAP offered  Rhogam given for RH negative Pediatrician: discussed. Infant feeding: plans to breastfeed. Maternity leave: not discussed. Cigarette smoking: quit 2015. Orders Placed This Encounter  Procedures  . POCT urinalysis dipstick   No orders of the defined types were placed in this encounter.   Follow up in 1 Week.

## 2013-10-28 ENCOUNTER — Ambulatory Visit (INDEPENDENT_AMBULATORY_CARE_PROVIDER_SITE_OTHER): Payer: Medicaid Other | Admitting: Obstetrics

## 2013-10-28 ENCOUNTER — Encounter: Payer: Self-pay | Admitting: Obstetrics

## 2013-10-28 VITALS — BP 135/91 | HR 99 | Temp 98.0°F | Wt 149.0 lb

## 2013-10-28 DIAGNOSIS — Z3483 Encounter for supervision of other normal pregnancy, third trimester: Secondary | ICD-10-CM

## 2013-10-28 LAB — POCT URINALYSIS DIPSTICK
Bilirubin, UA: NEGATIVE
Blood, UA: NEGATIVE
GLUCOSE UA: NEGATIVE
Ketones, UA: NEGATIVE
LEUKOCYTES UA: NEGATIVE
NITRITE UA: NEGATIVE
PROTEIN UA: NEGATIVE
Spec Grav, UA: 1.015
UROBILINOGEN UA: NEGATIVE
pH, UA: 6.5

## 2013-10-28 NOTE — Progress Notes (Signed)
Subjective:    Maria Compton is a 28 y.o. female being seen today for her obstetrical visit. She is at 407w2d gestation. Patient reports no complaints. Fetal movement: normal.  Problem List Items Addressed This Visit   None    Visit Diagnoses   Encounter for supervision of other normal pregnancy in third trimester    -  Primary    Relevant Orders       POCT urinalysis dipstick       Strep B DNA probe      Patient Active Problem List   Diagnosis Date Noted  . Normal delivery 12/24/2012  . GERD without esophagitis 09/18/2012   Objective:    BP 135/91  Pulse 99  Temp(Src) 98 F (36.7 C)  Wt 149 lb (67.586 kg)  LMP 02/08/2013 FHT:  140 BPM  Uterine Size: size equals dates  Presentation: unsure     Assessment:    Pregnancy @ 757w2d weeks   Plan:     labs reviewed, problem list updated Consent signed. GBS sent TDAP offered  Rhogam given for RH negative Pediatrician: discussed. Infant feeding: plans to breastfeed. Maternity leave: not discussed. Cigarette smoking: quit May 2015. Orders Placed This Encounter  Procedures  . Strep B DNA probe  . POCT urinalysis dipstick   No orders of the defined types were placed in this encounter.   Follow up in 1 Week.

## 2013-10-30 LAB — STREP B DNA PROBE: GBSP: NOT DETECTED

## 2013-11-05 ENCOUNTER — Encounter: Payer: Self-pay | Admitting: Obstetrics

## 2013-11-05 ENCOUNTER — Ambulatory Visit (INDEPENDENT_AMBULATORY_CARE_PROVIDER_SITE_OTHER): Payer: Medicaid Other | Admitting: Obstetrics

## 2013-11-05 VITALS — BP 120/70 | HR 91 | Temp 97.7°F | Wt 151.0 lb

## 2013-11-05 DIAGNOSIS — Z3483 Encounter for supervision of other normal pregnancy, third trimester: Secondary | ICD-10-CM

## 2013-11-05 LAB — POCT URINALYSIS DIPSTICK
BILIRUBIN UA: NEGATIVE
Blood, UA: NEGATIVE
Glucose, UA: NEGATIVE
Ketones, UA: NEGATIVE
LEUKOCYTES UA: NEGATIVE
Nitrite, UA: NEGATIVE
PH UA: 6
Protein, UA: NEGATIVE
Spec Grav, UA: 1.015
UROBILINOGEN UA: NEGATIVE

## 2013-11-05 NOTE — Addendum Note (Signed)
Addended by: Marya LandryFOSTER, Rochele Lueck D on: 11/05/2013 11:26 AM   Modules accepted: Orders

## 2013-11-05 NOTE — Progress Notes (Signed)
Subjective:    Maria Compton is a 28 y.o. female being seen today for her obstetrical visit. She is at 338w3d gestation. Patient reports no complaints. Fetal movement: normal.  Problem List Items Addressed This Visit   None     Patient Active Problem List   Diagnosis Date Noted  . Normal delivery 12/24/2012  . GERD without esophagitis 09/18/2012   Objective:    LMP 02/08/2013 FHT:  140 BPM  Uterine Size: size equals dates  Presentation: unsure     Assessment:    Pregnancy @ 5438w3d weeks   Plan:     labs reviewed, problem list updated Consent signed. GBS sent TDAP offered  Rhogam given for RH negative Pediatrician: discussed. Infant feeding: plans to breastfeed. Maternity leave: discussed. Cigarette smoking: quit May 2015. No orders of the defined types were placed in this encounter.   No orders of the defined types were placed in this encounter.   Follow up in 1 Week.

## 2013-11-11 ENCOUNTER — Encounter: Payer: Self-pay | Admitting: Obstetrics

## 2013-11-12 ENCOUNTER — Ambulatory Visit (INDEPENDENT_AMBULATORY_CARE_PROVIDER_SITE_OTHER): Payer: Medicaid Other | Admitting: Obstetrics

## 2013-11-12 ENCOUNTER — Encounter: Payer: Self-pay | Admitting: Obstetrics

## 2013-11-12 VITALS — BP 122/76 | HR 93 | Temp 97.5°F | Wt 151.0 lb

## 2013-11-12 DIAGNOSIS — Z3483 Encounter for supervision of other normal pregnancy, third trimester: Secondary | ICD-10-CM

## 2013-11-12 NOTE — Progress Notes (Signed)
Subjective:    Maria Compton is a 28 y.o. female being seen today for her obstetrical visit. She is at 107w3d gestation. Patient reports no complaints. Fetal movement: normal.  Problem List Items Addressed This Visit    None    Visit Diagnoses    Encounter for supervision of other normal pregnancy in third trimester    -  Primary    Relevant Orders       POCT urinalysis dipstick      Patient Active Problem List   Diagnosis Date Noted  . Normal delivery 12/24/2012  . GERD without esophagitis 09/18/2012    Objective:    BP 122/76 mmHg  Pulse 93  Temp(Src) 97.5 F (36.4 C)  Wt 151 lb (68.493 kg)  LMP 02/08/2013 FHT: 140 BPM  Uterine Size: size equals dates  Presentations: unsure  Pelvic Exam: Deferred    Assessment:    Pregnancy @ 717w3d weeks   Plan:   Plans for delivery: Vaginal anticipated; labs reviewed; problem list updated Counseling: Consent signed. Infant feeding: plans to breastfeed. Cigarette smoking: quit date May 2015. L&D discussion: symptoms of labor, discussed when to call, discussed what number to call, anesthetic/analgesic options reviewed and delivering clinician:  plans Physician. Postpartum supports and preparation: circumcision discussed and contraception plans discussed.  Follow up in 1 Week.

## 2013-11-13 LAB — POCT URINALYSIS DIPSTICK
Bilirubin, UA: NEGATIVE
Blood, UA: NEGATIVE
GLUCOSE UA: NEGATIVE
Ketones, UA: NEGATIVE
Leukocytes, UA: NEGATIVE
Nitrite, UA: NEGATIVE
PH UA: 7
Protein, UA: NEGATIVE
SPEC GRAV UA: 1.015
UROBILINOGEN UA: NEGATIVE

## 2013-11-19 ENCOUNTER — Ambulatory Visit (INDEPENDENT_AMBULATORY_CARE_PROVIDER_SITE_OTHER): Payer: Medicaid Other | Admitting: Obstetrics

## 2013-11-19 ENCOUNTER — Encounter: Payer: Self-pay | Admitting: Obstetrics

## 2013-11-19 VITALS — BP 117/73 | HR 91 | Temp 97.0°F | Wt 148.0 lb

## 2013-11-19 DIAGNOSIS — Z3483 Encounter for supervision of other normal pregnancy, third trimester: Secondary | ICD-10-CM

## 2013-11-19 LAB — POCT URINALYSIS DIPSTICK
Bilirubin, UA: NEGATIVE
Glucose, UA: NEGATIVE
KETONES UA: NEGATIVE
Leukocytes, UA: NEGATIVE
Nitrite, UA: NEGATIVE
RBC UA: NEGATIVE
Spec Grav, UA: 1.015
Urobilinogen, UA: NEGATIVE
pH, UA: 6

## 2013-11-19 NOTE — Progress Notes (Signed)
Subjective:    Maria Compton is a 28 y.o. female being seen today for her obstetrical visit. She is at 3449w3d gestation. Patient reports no complaints. Fetal movement: normal.  Problem List Items Addressed This Visit    None    Visit Diagnoses    Encounter for supervision of other normal pregnancy in third trimester    -  Primary    Relevant Orders       POCT urinalysis dipstick (Completed)      Patient Active Problem List   Diagnosis Date Noted  . Normal delivery 12/24/2012  . GERD without esophagitis 09/18/2012    Objective:    BP 117/73 mmHg  Pulse 91  Temp(Src) 97 F (36.1 C)  Wt 148 lb (67.132 kg)  LMP 02/08/2013 FHT: 140 BPM  Uterine Size: size equals dates  Presentations: cephalic  Pelvic Exam: Deferred    Assessment:    Pregnancy @ 5449w3d weeks   Plan:   Plans for delivery: Vaginal anticipated; labs reviewed; problem list updated Counseling: Consent signed. Infant feeding: plans to breastfeed. Cigarette smoking: quit May 2015. L&D discussion: symptoms of labor, discussed when to call, discussed what number to call, anesthetic/analgesic options reviewed and delivering clinician:  plans Physician. Postpartum supports and preparation: circumcision discussed and contraception plans discussed.  Follow up in 1 Week.

## 2013-11-23 ENCOUNTER — Encounter (HOSPITAL_COMMUNITY): Payer: Self-pay | Admitting: *Deleted

## 2013-11-23 ENCOUNTER — Inpatient Hospital Stay (HOSPITAL_COMMUNITY): Payer: Medicaid Other | Admitting: Anesthesiology

## 2013-11-23 ENCOUNTER — Inpatient Hospital Stay (HOSPITAL_COMMUNITY)
Admission: AD | Admit: 2013-11-23 | Discharge: 2013-11-25 | DRG: 775 | Disposition: A | Discharge status: Home / Self Care | Payer: Medicaid Other | Source: Ambulatory Visit | Attending: Obstetrics | Admitting: Obstetrics

## 2013-11-23 DIAGNOSIS — Z3A39 39 weeks gestation of pregnancy: Secondary | ICD-10-CM | POA: Diagnosis present

## 2013-11-23 DIAGNOSIS — Z87891 Personal history of nicotine dependence: Secondary | ICD-10-CM

## 2013-11-23 DIAGNOSIS — O9989 Other specified diseases and conditions complicating pregnancy, childbirth and the puerperium: Secondary | ICD-10-CM | POA: Diagnosis present

## 2013-11-23 LAB — CBC
HEMATOCRIT: 34.8 % — AB (ref 36.0–46.0)
HEMOGLOBIN: 12.3 g/dL (ref 12.0–15.0)
MCH: 30.4 pg (ref 26.0–34.0)
MCHC: 35.3 g/dL (ref 30.0–36.0)
MCV: 85.9 fL (ref 78.0–100.0)
Platelets: 264 10*3/uL (ref 150–400)
RBC: 4.05 MIL/uL (ref 3.87–5.11)
RDW: 13.1 % (ref 11.5–15.5)
WBC: 15.8 10*3/uL — ABNORMAL HIGH (ref 4.0–10.5)

## 2013-11-23 LAB — TYPE AND SCREEN
ABO/RH(D): A POS
Antibody Screen: NEGATIVE

## 2013-11-23 LAB — RPR

## 2013-11-23 LAB — POCT FERN TEST: POCT FERN TEST: POSITIVE

## 2013-11-23 MED ORDER — TERBUTALINE SULFATE 1 MG/ML IJ SOLN: 0.2500 mg | Freq: Once | INTRAMUSCULAR | Status: DC | PRN

## 2013-11-23 MED ORDER — ONDANSETRON HCL 4 MG/2ML IJ SOLN: 4.0000 mg | INTRAMUSCULAR | Status: DC | PRN

## 2013-11-23 MED ORDER — ONDANSETRON HCL 4 MG PO TABS: 4.0000 mg | ORAL_TABLET | ORAL | Status: DC | PRN

## 2013-11-23 MED ORDER — LANOLIN HYDROUS EX OINT: TOPICAL_OINTMENT | CUTANEOUS | Status: DC | PRN

## 2013-11-23 MED ORDER — LACTATED RINGERS IV SOLN
500.0000 mL | Freq: Once | INTRAVENOUS | Status: AC
  Administered 2013-11-23: 1000 mL via INTRAVENOUS

## 2013-11-23 MED ORDER — OXYCODONE-ACETAMINOPHEN 5-325 MG PO TABS: 1.0000 | ORAL_TABLET | ORAL | Status: DC | PRN

## 2013-11-23 MED ORDER — LACTATED RINGERS IV SOLN
500.0000 mL | INTRAVENOUS | Status: DC | PRN
  Administered 2013-11-23: 1000 mL via INTRAVENOUS

## 2013-11-23 MED ORDER — CITRIC ACID-SODIUM CITRATE 334-500 MG/5ML PO SOLN: 30.0000 mL | ORAL | Status: DC | PRN

## 2013-11-23 MED ORDER — EPHEDRINE 5 MG/ML INJ
10.0000 mg | INTRAVENOUS | Status: DC | PRN
  Filled 2013-11-23: qty 2

## 2013-11-23 MED ORDER — FENTANYL 2.5 MCG/ML BUPIVACAINE 1/10 % EPIDURAL INFUSION (WH - ANES)
14.0000 mL/h | INTRAMUSCULAR | Status: DC | PRN
  Filled 2013-11-23: qty 125

## 2013-11-23 MED ORDER — PRENATAL MULTIVITAMIN CH
1.0000 | ORAL_TABLET | Freq: Every day | ORAL | Status: DC
  Administered 2013-11-24 – 2013-11-25 (×2): 1 via ORAL
  Filled 2013-11-23 (×2): qty 1

## 2013-11-23 MED ORDER — OXYTOCIN 40 UNITS IN LACTATED RINGERS INFUSION - SIMPLE MED
1.0000 m[IU]/min | INTRAVENOUS | Status: DC
  Administered 2013-11-23: 1 m[IU]/min via INTRAVENOUS
  Filled 2013-11-23: qty 1000

## 2013-11-23 MED ORDER — TETANUS-DIPHTH-ACELL PERTUSSIS 5-2.5-18.5 LF-MCG/0.5 IM SUSP: 0.5000 mL | Freq: Once | INTRAMUSCULAR | Status: DC

## 2013-11-23 MED ORDER — BENZOCAINE-MENTHOL 20-0.5 % EX AERO: 1.0000 "application " | INHALATION_SPRAY | CUTANEOUS | Status: DC | PRN

## 2013-11-23 MED ORDER — SIMETHICONE 80 MG PO CHEW: 80.0000 mg | CHEWABLE_TABLET | ORAL | Status: DC | PRN

## 2013-11-23 MED ORDER — DIBUCAINE 1 % RE OINT: 1.0000 "application " | TOPICAL_OINTMENT | RECTAL | Status: DC | PRN

## 2013-11-23 MED ORDER — OXYTOCIN BOLUS FROM INFUSION
500.0000 mL | INTRAVENOUS | Status: DC
  Administered 2013-11-23: 500 mL via INTRAVENOUS

## 2013-11-23 MED ORDER — FENTANYL 2.5 MCG/ML BUPIVACAINE 1/10 % EPIDURAL INFUSION (WH - ANES)
INTRAMUSCULAR | Status: DC | PRN
  Administered 2013-11-23: 14 mL/h via EPIDURAL

## 2013-11-23 MED ORDER — OXYCODONE-ACETAMINOPHEN 5-325 MG PO TABS: 2.0000 | ORAL_TABLET | ORAL | Status: DC | PRN

## 2013-11-23 MED ORDER — LACTATED RINGERS IV SOLN: INTRAVENOUS | Status: DC

## 2013-11-23 MED ORDER — FLEET ENEMA 7-19 GM/118ML RE ENEM: 1.0000 | ENEMA | RECTAL | Status: DC | PRN

## 2013-11-23 MED ORDER — ZOLPIDEM TARTRATE 5 MG PO TABS: 5.0000 mg | ORAL_TABLET | Freq: Every evening | ORAL | Status: DC | PRN

## 2013-11-23 MED ORDER — ONDANSETRON HCL 4 MG/2ML IJ SOLN: 4.0000 mg | Freq: Four times a day (QID) | INTRAMUSCULAR | Status: DC | PRN

## 2013-11-23 MED ORDER — DIPHENHYDRAMINE HCL 25 MG PO CAPS: 25.0000 mg | ORAL_CAPSULE | Freq: Four times a day (QID) | ORAL | Status: DC | PRN

## 2013-11-23 MED ORDER — ACETAMINOPHEN 325 MG PO TABS: 650.0000 mg | ORAL_TABLET | ORAL | Status: DC | PRN

## 2013-11-23 MED ORDER — IBUPROFEN 600 MG PO TABS
600.0000 mg | ORAL_TABLET | Freq: Four times a day (QID) | ORAL | Status: DC
  Administered 2013-11-23 – 2013-11-25 (×7): 600 mg via ORAL
  Filled 2013-11-23 (×6): qty 1

## 2013-11-23 MED ORDER — SENNOSIDES-DOCUSATE SODIUM 8.6-50 MG PO TABS
2.0000 | ORAL_TABLET | ORAL | Status: DC
  Administered 2013-11-23 – 2013-11-25 (×2): 2 via ORAL
  Filled 2013-11-23: qty 2

## 2013-11-23 MED ORDER — OXYTOCIN 40 UNITS IN LACTATED RINGERS INFUSION - SIMPLE MED: 62.5000 mL/h | INTRAVENOUS | Status: DC | PRN

## 2013-11-23 MED ORDER — OXYTOCIN 40 UNITS IN LACTATED RINGERS INFUSION - SIMPLE MED: 62.5000 mL/h | INTRAVENOUS | Status: DC

## 2013-11-23 MED ORDER — PHENYLEPHRINE 40 MCG/ML (10ML) SYRINGE FOR IV PUSH (FOR BLOOD PRESSURE SUPPORT)
80.0000 ug | PREFILLED_SYRINGE | INTRAVENOUS | Status: DC | PRN
  Filled 2013-11-23: qty 2
  Filled 2013-11-23: qty 10

## 2013-11-23 MED ORDER — WITCH HAZEL-GLYCERIN EX PADS: 1.0000 "application " | MEDICATED_PAD | CUTANEOUS | Status: DC | PRN

## 2013-11-23 MED ORDER — DIPHENHYDRAMINE HCL 50 MG/ML IJ SOLN: 12.5000 mg | INTRAMUSCULAR | Status: DC | PRN

## 2013-11-23 MED ORDER — LIDOCAINE HCL (PF) 1 % IJ SOLN
INTRAMUSCULAR | Status: DC | PRN
  Administered 2013-11-23 (×2): 4 mL

## 2013-11-23 MED ORDER — LIDOCAINE HCL (PF) 1 % IJ SOLN
30.0000 mL | INTRAMUSCULAR | Status: DC | PRN
  Filled 2013-11-23 (×2): qty 30

## 2013-11-23 MED ORDER — PHENYLEPHRINE 40 MCG/ML (10ML) SYRINGE FOR IV PUSH (FOR BLOOD PRESSURE SUPPORT)
80.0000 ug | PREFILLED_SYRINGE | INTRAVENOUS | Status: DC | PRN
  Filled 2013-11-23: qty 2

## 2013-11-23 NOTE — Anesthesia Procedure Notes (Signed)
Epidural Patient location during procedure: OB  Staffing Anesthesiologist: Iona Stay R Performed by: anesthesiologist   Preanesthetic Checklist Completed: patient identified, pre-op evaluation, timeout performed, IV checked, risks and benefits discussed and monitors and equipment checked  Epidural Patient position: sitting Prep: site prepped and draped and DuraPrep Patient monitoring: heart rate Approach: midline Location: L3-L4 Injection technique: LOR air and LOR saline  Needle:  Needle type: Tuohy  Needle gauge: 17 G Needle length: 9 cm Needle insertion depth: 5 cm Catheter type: closed end flexible Catheter size: 19 Gauge Catheter at skin depth: 11 cm Test dose: negative  Assessment Sensory level: T8 Events: blood not aspirated, injection not painful, no injection resistance, negative IV test and no paresthesia  Additional Notes Reason for block:procedure for pain   

## 2013-11-23 NOTE — Progress Notes (Signed)
Maria Compton is a 28 y.o. G4P1021 at 7942w0d by LMP admitted for rupture of membranes  Subjective:   Objective: BP 121/103 mmHg  Pulse 69  Temp(Src) 98.5 F (36.9 C) (Oral)  Resp 22  Ht 5\' 3"  (1.6 m)  Wt 152 lb (68.947 kg)  BMI 26.93 kg/m2  SpO2 99%  LMP 02/08/2013      FHT:  FHR: 135 bpm, variability: moderate,  accelerations:  Present,  decelerations:  Absent UC:   regular, every 2-3 minutes SVE:   Dilation: 9 Effacement (%): 100 Station: +1 Exam by:: Larose KellsK Fraley RN   Labs: Lab Results  Component Value Date   WBC 15.8* 11/23/2013   HGB 12.3 11/23/2013   HCT 34.8* 11/23/2013   MCV 85.9 11/23/2013   PLT 264 11/23/2013    Assessment / Plan: Augmentation of labor, progressing well  Labor: Progressing normally Preeclampsia:  n/a Fetal Wellbeing:  Category I Pain Control:  Epidural I/D:  n/a Anticipated MOD:  NSVD  HARPER,CHARLES A 11/23/2013, 7:41 PM

## 2013-11-23 NOTE — H&P (Signed)
Maria Compton is a 28 y.o. female presenting for SROM. Maternal Medical History:  Reason for admission: Rupture of membranes.   Contractions: Onset was 3-5 hours ago.   Frequency: irregular.   Perceived severity is mild.    Fetal activity: Perceived fetal activity is normal.    Prenatal complications: no prenatal complications Prenatal Complications - Diabetes: none.    OB History    Gravida Para Term Preterm AB TAB SAB Ectopic Multiple Living   4 1 1  2     1      Past Medical History  Diagnosis Date  . Medical history non-contributory    Past Surgical History  Procedure Laterality Date  . No past surgeries     Family History: family history includes Cancer in her maternal aunt and maternal grandmother. Social History:  reports that she quit smoking about 5 months ago. Her smoking use included Cigarettes. She has a 1 pack-year smoking history. She has never used smokeless tobacco. She reports that she does not drink alcohol or use illicit drugs.   Prenatal Transfer Tool  Maternal Diabetes: No Genetic Screening: Normal Maternal Ultrasounds/Referrals: Normal Fetal Ultrasounds or other Referrals:  None Maternal Substance Abuse:  No Significant Maternal Medications:  None Significant Maternal Lab Results:  None Other Comments:  None  Review of Systems  All other systems reviewed and are negative.   Dilation: 3.5 Effacement (%): 80 Station: -2 Exam by:: A. Gagliardo, RN Height 5' 3.5" (1.613 m), weight 152 lb (68.947 kg), last menstrual period 02/08/2013, not currently breastfeeding. Maternal Exam:  Uterine Assessment: Contraction strength is mild.  Contraction frequency is irregular.   Abdomen: Patient reports no abdominal tenderness. Fetal presentation: vertex  Pelvis: adequate for delivery.   Cervix: Cervix evaluated by digital exam.     Physical Exam  Nursing note and vitals reviewed. Constitutional: She is oriented to person, place, and time. She  appears well-developed and well-nourished.  HENT:  Head: Normocephalic and atraumatic.  Eyes: Conjunctivae are normal. Pupils are equal, round, and reactive to light.  Neck: Normal range of motion. Neck supple.  Cardiovascular: Normal rate and regular rhythm.   Respiratory: Effort normal and breath sounds normal.  GI: Soft.  Musculoskeletal: Normal range of motion.  Neurological: She is alert and oriented to person, place, and time.  Skin: Skin is warm and dry.  Psychiatric: She has a normal mood and affect. Her behavior is normal. Judgment and thought content normal.    Prenatal labs: ABO, Rh: A/POS/-- (06/01 1322) Antibody: NEG (06/01 1322) Rubella: 1.37 (06/01 1322) RPR: NON REAC (07/28 1206)  HBsAg: NEGATIVE (06/01 1322)  HIV: NONREACTIVE (07/28 1206)  GBS: NOT DETECTED (10/19 1357)   Assessment/Plan: 39 weeks.  SROM.  Admit.  Pitocin prn.   Bridgitt Raggio A 11/23/2013, 11:22 AM

## 2013-11-23 NOTE — MAU Note (Signed)
Clear fluid, started leaking at 0830- still coming.  Some irregular contractions. Small amt of slimy d/c. No problems with labor.

## 2013-11-23 NOTE — Progress Notes (Signed)
Maria Compton is a 28 y.o. G4P1021 at 6361w0d by LMP admitted for rupture of membranes  Subjective:   Objective: BP 122/86 mmHg  Pulse 90  Temp(Src) 98 F (36.7 C) (Oral)  Resp 16  Ht 5\' 3"  (1.6 m)  Wt 152 lb (68.947 kg)  BMI 26.93 kg/m2  LMP 02/08/2013      FHT:  FHR: 150 bpm, variability: moderate,  accelerations:  Present,  decelerations:  Absent UC:   Irregular, mild SVE:   Dilation: 3.5 Effacement (%): 80 Station: -2 Exam by:: Exie ParodyA. Gagliardo, RN  Labs: Lab Results  Component Value Date   WBC 15.8* 11/23/2013   HGB 12.3 11/23/2013   HCT 34.8* 11/23/2013   MCV 85.9 11/23/2013   PLT 264 11/23/2013    Assessment / Plan: 39 weeks.  SROM.  Early labor.  Pitocin augmentation per low dose protocol.  Labor: Latent phase Preeclampsia:  n/a Fetal Wellbeing:  Category I Pain Control:  Labor support without medications I/D:  n/a Anticipated MOD:  NSVD  Cherisse Carrell A 11/23/2013, 12:41 PM

## 2013-11-23 NOTE — Anesthesia Preprocedure Evaluation (Signed)
Anesthesia Evaluation  Patient identified by MRN, date of birth, ID band Patient awake    Reviewed: Allergy & Precautions, H&P , Patient's Chart, lab work & pertinent test results  Airway Mallampati: II  TM Distance: >3 FB Neck ROM: full    Dental no notable dental hx. (+) Teeth Intact   Pulmonary Current Smoker, former smoker,  breath sounds clear to auscultation  Pulmonary exam normal       Cardiovascular Rhythm:regular Rate:Normal     Neuro/Psych negative neurological ROS  negative psych ROS   GI/Hepatic Neg liver ROS, GERD-  Medicated,  Endo/Other  negative endocrine ROS  Renal/GU negative Renal ROS     Musculoskeletal negative musculoskeletal ROS (+)   Abdominal   Peds  Hematology negative hematology ROS (+)   Anesthesia Other Findings       Reproductive/Obstetrics (+) Pregnancy                             Anesthesia Physical  Anesthesia Plan  ASA: II  Anesthesia Plan: Epidural   Post-op Pain Management:    Induction:   Airway Management Planned:   Additional Equipment:   Intra-op Plan:   Post-operative Plan:   Informed Consent: I have reviewed the patients History and Physical, chart, labs and discussed the procedure including the risks, benefits and alternatives for the proposed anesthesia with the patient or authorized representative who has indicated his/her understanding and acceptance.   Dental Advisory Given  Plan Discussed with:   Anesthesia Plan Comments:         Anesthesia Quick Evaluation

## 2013-11-24 ENCOUNTER — Encounter (HOSPITAL_COMMUNITY): Payer: Self-pay | Admitting: *Deleted

## 2013-11-24 LAB — CBC
HCT: 33.3 % — ABNORMAL LOW (ref 36.0–46.0)
HEMOGLOBIN: 11.5 g/dL — AB (ref 12.0–15.0)
MCH: 29.9 pg (ref 26.0–34.0)
MCHC: 34.5 g/dL (ref 30.0–36.0)
MCV: 86.5 fL (ref 78.0–100.0)
Platelets: 221 10*3/uL (ref 150–400)
RBC: 3.85 MIL/uL — AB (ref 3.87–5.11)
RDW: 13.2 % (ref 11.5–15.5)
WBC: 18 10*3/uL — ABNORMAL HIGH (ref 4.0–10.5)

## 2013-11-24 LAB — ABO/RH: ABO/RH(D): A POS

## 2013-11-24 NOTE — Lactation Note (Signed)
This note was copied from the chart of Maria Hunt Orisobin Keeran. Lactation Consultation Note  Patient Name: Maria Hunt OrisRobin Gin WUJWJ'XToday's Date: 11/24/2013 Reason for consult: Initial assessment Mom had just latched [facility]. Mom reports nipple tenderness with initial latch that improves with baby nursing. Mom has been having difficulty getting baby to latch per her report but with this feeding baby was easier to latch per Mom. Baby appears to have good depth. Sleepy but with stimulation demonstrates a good rhythmic suck, some swallowing motions noted. When baby came off the breast very slight compression line visible. Aerola red but no cracking or bleeding. Care for sore nipples reviewed with Mom. Advised to apply EBM, comfort gels given with instructions. Mom plans to breast/bottle feed. Reviewed risk of early supplementation to BF success. Encouraged Mom to BF 1st with each feeding to encourage milk production, prevent engorgement and protect milk supply. Guidelines for supplementing with BF reviewed with Mom. Mom reports she will spoon or finger feed at this time if she supplement, but she reports being encouraged that this baby is latching better than her 1st child. Encouraged to call for assist as desired.   Maternal Data Has patient been taught Hand Expression?: Yes (Mom reports she knows how to hand express) Does the patient have breastfeeding experience prior to this delivery?: Yes  Feeding Feeding Type: Breast Fed Length of feed: 15 min  LATCH Score/Interventions Latch: Repeated attempts needed to sustain latch, nipple held in mouth throughout feeding, stimulation needed to elicit sucking reflex.  Audible Swallowing: A few with stimulation  Type of Nipple: Flat (Become more erect with pre-pumping per Mom) Intervention(s): Hand pump  Comfort (Breast/Nipple): Filling, red/small blisters or bruises, mild/mod discomfort  Problem noted: Mild/Moderate discomfort Interventions  (Mild/moderate discomfort): Comfort gels;Hand massage;Hand expression  Hold (Positioning): Assistance needed to correctly position infant at breast and maintain latch.  LATCH Score: 5  Lactation Tools Discussed/Used WIC Program: Yes   Consult Status Consult Status: Follow-up Date: 11/25/13 Follow-up type: In-patient    Maria Compton, Maria Compton 11/24/2013, 6:23 PM

## 2013-11-24 NOTE — Lactation Note (Signed)
This note was copied from the chart of Maria Hunt Orisobin Mcgruder. Lactation Consultation Note  Patient Name: Maria Compton ZOXWR'UToday's Date: 11/24/2013 Reason for consult: Follow-up assessment;Breast/nipple pain;Difficult latch RN called for LC to assist with feeding. Mom is requesting a nipple shield due to nipple soreness and having difficulty with baby latching. Bilateral nipples are red, no cracking/bleeding observed. LC initiated #20 nipple shield and demonstrated to Mom how to apply and how to assess for deep latch. Demonstrated how to hand express colostrum into the nipple shield to help with latch. Baby has very tight mouth and keep lower lip tucked. Frenulum palpated with finger sweep and chewing motion with suck exam before putting baby to breast. Demonstrated to Mom how to bring bottom lip down for deeper latch. Baby nursed off and on for 10 minutes, scant amount of colostrum visible in the nipple shield. Discussed post pumping using DEBP with Mom. She reports being exhausted and declined pump at this time. She has hand pump and reports she will use the hand pump if she does any pumping. Mom has WIC [facility] Encouraged to call for assist with next feeding.  Maternal Data Has patient been taught Hand Expression?: Yes (Mom reports she knows how to hand express) Does the patient have breastfeeding experience prior to this delivery?: Yes  Feeding Feeding Type: Breast Fed Length of feed: 10 min (off/on)  LATCH Score/Interventions Latch: Repeated attempts needed to sustain latch, nipple held in mouth throughout feeding, stimulation needed to elicit sucking reflex. Intervention(s): Adjust position;Assist with latch  Audible Swallowing: A few with stimulation Intervention(s): Skin to skin  Type of Nipple: Flat Intervention(s): Hand pump  Comfort (Breast/Nipple): Filling, red/small blisters or bruises, mild/mod discomfort  Problem noted: Mild/Moderate  discomfort Interventions (Mild/moderate discomfort): Comfort gels  Hold (Positioning): Assistance needed to correctly position infant at breast and maintain latch. Intervention(s): Breastfeeding basics reviewed;Support Pillows;Position options;Skin to skin  LATCH Score: 5  Lactation Tools Discussed/Used Tools: Nipple Shields;Pump;Comfort gels Nipple shield size: 20 Breast pump type: Manual WIC Program: Yes   Consult Status Consult Status: Follow-up Date: 11/25/13 Follow-up type: In-patient    Maria Compton, Maria Compton 11/24/2013, 8:47 PM

## 2013-11-24 NOTE — Anesthesia Postprocedure Evaluation (Signed)
Anesthesia Post Note  Patient: Maria Compton  Procedure(s) Performed: * No procedures listed *  Anesthesia type: Epidural  Patient location: Mother/Baby  Post pain: Pain level controlled  Post assessment: Post-op Vital signs reviewed  Last Vitals:  Filed Vitals:   11/24/13 0330  BP:   Pulse: 70  Temp: 36.9 C  Resp: 18    Post vital signs: Reviewed  Level of consciousness:alert  Complications: No apparent anesthesia complications

## 2013-11-24 NOTE — Progress Notes (Signed)
Post Partum Day 1 Subjective: no complaints  Objective: Blood pressure 114/65, pulse 70, temperature 98.5 F (36.9 C), temperature source Axillary, resp. rate 18, height 5\' 3"  (1.6 m), weight 152 lb (68.947 kg), last menstrual period 02/08/2013, SpO2 97 %, unknown if currently breastfeeding.  Physical Exam:  General: alert and no distress Lochia: appropriate Uterine Fundus: firm Incision: none DVT Evaluation: No evidence of DVT seen on physical exam.   Recent Labs  11/23/13 1155 11/24/13 0554  HGB 12.3 11.5*  HCT 34.8* 33.3*    Assessment/Plan: Plan for discharge tomorrow   LOS: 1 day   Markon Jares A 11/24/2013, 7:05 AM

## 2013-11-24 NOTE — Plan of Care (Signed)
Problem: Phase II Progression Outcomes Goal: Pain controlled on oral analgesia Outcome: Completed/Met Date Met:  11/24/13 Goal: Progress activity as tolerated unless otherwise ordered Outcome: Completed/Met Date Met:  11/24/13 Goal: Afebrile, VS remain stable Outcome: Completed/Met Date Met:  11/24/13 Goal: Tolerating diet Outcome: Completed/Met Date Met:  11/24/13 Goal: Other Phase II Outcomes/Goals Outcome: Completed/Met Date Met:  11/24/13

## 2013-11-24 NOTE — Plan of Care (Signed)
Problem: Phase I Progression Outcomes Goal: Other Phase I Outcomes/Goals Outcome: Not Applicable Date Met:  11/24/13     

## 2013-11-25 MED ORDER — OXYCODONE-ACETAMINOPHEN 5-325 MG PO TABS: 1.0000 | ORAL_TABLET | ORAL | Status: DC | PRN

## 2013-11-25 MED ORDER — IBUPROFEN 600 MG PO TABS: 600.0000 mg | ORAL_TABLET | Freq: Four times a day (QID) | ORAL | Status: DC | PRN

## 2013-11-25 NOTE — Discharge Summary (Signed)
Obstetric Discharge Summary Reason for Admission: onset of labor Prenatal Procedures: ultrasound Intrapartum Procedures: spontaneous vaginal delivery Postpartum Procedures: none Complications-Operative and Postpartum: none HEMOGLOBIN  Date Value Ref Range Status  11/24/2013 11.5* 12.0 - 15.0 g/dL Final   HCT  Date Value Ref Range Status  11/24/2013 33.3* 36.0 - 46.0 % Final    Physical Exam:  General: alert and no distress Lochia: appropriate Uterine Fundus: firm Incision: none DVT Evaluation: No evidence of DVT seen on physical exam.  Discharge Diagnoses: Term Pregnancy-delivered  Discharge Information: Date: 11/25/2013 Activity: pelvic rest Diet: routine Medications: PNV, Ibuprofen, Colace and Percocet Condition: stable Instructions: refer to practice specific booklet Discharge to: home Follow-up Information    Follow up with Jeanmarie Mccowen A, MD. Schedule an appointment as soon as possible for a visit in 2 weeks.   Specialty:  Obstetrics and Gynecology   Contact information:   [address] [place] [address] [phone]       Newborn Data: Live born female  Birth Weight: 7 lb 6.5 oz (3359 g) APGAR: 8, 9  Home with mother.  Cage Gupton A 11/25/2013, 8:09 AM

## 2013-11-25 NOTE — Plan of Care (Signed)
Problem: Discharge Progression Outcomes Goal: Tolerating diet Outcome: Completed/Met Date Met:  11/25/13     

## 2013-11-25 NOTE — Progress Notes (Signed)
UR chart review completed.  

## 2013-11-25 NOTE — Lactation Note (Signed)
This note was copied from the chart of Maria Hunt Orisobin Sindt. Lactation Consultation Note  Patient Name: Maria Compton WUJWJ'XToday's Date: 11/25/2013 Reason for consult: Follow-up assessment;Breast/nipple pain;Difficult latch Mom called to see LC. Parents report Mom has only had 2 hours of sleep,  Mom is exhausted and they would like to supplement baby this feeding so Mom can get some rest. Demonstrated to FOB how to supplement with finger feeding using curved tipped syringe. FOB returned demonstration giving baby 10 ml of Similac 19 cal formula.  Mom reports once she gets some rest she will attempt BF again using the nipple shield. LC encouraged parents to call for assist as needed.   Maternal Data    Feeding Feeding Type: Formula Length of feed: 10 min (off/on)  LATCH Score/Interventions Latch: Repeated attempts needed to sustain latch, nipple held in mouth throughout feeding, stimulation needed to elicit sucking reflex.  Audible Swallowing: A few with stimulation  Type of Nipple: Flat  Comfort (Breast/Nipple): Filling, red/small blisters or bruises, mild/mod discomfort  Problem noted: Mild/Moderate discomfort Interventions (Mild/moderate discomfort): Comfort gels  Hold (Positioning): Assistance needed to correctly position infant at breast and maintain latch. Intervention(s): Breastfeeding basics reviewed;Support Pillows;Position options;Skin to skin  LATCH Score: 5  Lactation Tools Discussed/Used Tools: Pump;Nipple Shields;Comfort gels Nipple shield size: 20 Breast pump type: Manual WIC Program: Yes   Consult Status Consult Status: Follow-up Date: 11/25/13 Follow-up type: In-patient    Maria Compton, Maria Compton 11/25/2013, 12:12 AM

## 2013-11-25 NOTE — Progress Notes (Signed)
Post Partum Day 2 Subjective: no complaints  Objective: Blood pressure 113/73, pulse 65, temperature 97.8 F (36.6 C), temperature source Oral, resp. rate 18, height 5\' 3"  (1.6 m), weight 152 lb (68.947 kg), last menstrual period 02/08/2013, SpO2 97 %, unknown if currently breastfeeding.  Physical Exam:  General: alert and no distress Lochia: appropriate Uterine Fundus: firm Incision: none DVT Evaluation: No evidence of DVT seen on physical exam.   Recent Labs  11/23/13 1155 11/24/13 0554  HGB 12.3 11.5*  HCT 34.8* 33.3*    Assessment/Plan: Discharge home   LOS: 2 days   HARPER,CHARLES A 11/25/2013, 8:07 AM

## 2013-11-25 NOTE — Lactation Note (Signed)
This note was copied from the chart of Maria Compton. Lactation Consultation Note    Follow up consult with this mom and baby, now 39 hours post partum. Mom is using a nipple shield to breast feed, and reports this going well. She knows to call for o/p lactation consult as needed. Mom has soft nipples that do not stay everted with compression. Her nipples and areolas are very red and tender. Her milk is transitioning in. [facility]  [facility] , and is using a hand pump to protect her supply and supplement the baby. [facility] in her getting a WIC DEP. I also gave mom comfort gels and instructed her in their use.   Patient Name: Maria Hunt OrisRobin Compton YNWGN'FToday's Date: 11/25/2013 Reason for consult: Follow-up assessment   Maternal Data    Feeding    LATCH Score/Interventions                      Lactation Tools Discussed/Used     Consult Status Consult Status: Complete Follow-up type: Call as needed    Alfred LevinsLee, Shelbie Franken Anne 11/25/2013, 2:19 PM

## 2013-11-25 NOTE — Plan of Care (Signed)
Problem: Discharge Progression Outcomes Goal: Activity appropriate for discharge plan Outcome: Completed/Met Date Met:  11/25/13     

## 2013-11-26 ENCOUNTER — Encounter: Payer: Medicaid Other | Admitting: Obstetrics

## 2013-12-12 ENCOUNTER — Ambulatory Visit: Payer: Medicaid Other | Admitting: Obstetrics

## 2014-01-10 NOTE — L&D Delivery Note (Signed)
Called to bedside for standby delivery given precipitous change.   Delivery Note At 5:20 PM a viable female was delivered via Vaginal, Spontaneous Delivery (Presentation: Right Occiput Anterior).  APGAR: 8, 9; weight-pending  .   Placenta status: Intact, Spontaneous.  Cord: 3 vessels with the following complications: None.    Anesthesia: None  Episiotomy: None  Nuchal x2, delivered through and placed on maternal abdomen. [facility].   Isa RankinKimberly Niles [facility] 12/24/2014, 5:31 PM

## 2014-07-16 ENCOUNTER — Encounter: Payer: Self-pay | Admitting: Obstetrics

## 2014-07-16 ENCOUNTER — Ambulatory Visit (INDEPENDENT_AMBULATORY_CARE_PROVIDER_SITE_OTHER): Payer: Medicaid Other | Admitting: Obstetrics

## 2014-07-16 VITALS — BP 121/82 | HR 83 | Temp 98.3°F | Wt 122.0 lb

## 2014-07-16 DIAGNOSIS — Z3482 Encounter for supervision of other normal pregnancy, second trimester: Secondary | ICD-10-CM

## 2014-07-16 DIAGNOSIS — Z3687 Encounter for antenatal screening for uncertain dates: Secondary | ICD-10-CM

## 2014-07-16 LAB — POCT URINALYSIS DIPSTICK
BILIRUBIN UA: NEGATIVE
Glucose, UA: NEGATIVE
Ketones, UA: NEGATIVE
Leukocytes, UA: NEGATIVE
Nitrite, UA: NEGATIVE
Protein, UA: NEGATIVE
RBC UA: NEGATIVE
SPEC GRAV UA: 1.01
Urobilinogen, UA: NEGATIVE
pH, UA: 6

## 2014-07-16 LAB — HIV ANTIBODY (ROUTINE TESTING W REFLEX): HIV: NONREACTIVE

## 2014-07-16 MED ORDER — CITRANATAL HARMONY 27-1-260 MG PO CAPS: 1.0000 | ORAL_CAPSULE | Freq: Every day | ORAL | Status: DC

## 2014-07-16 NOTE — Progress Notes (Signed)
Subjective:    Maria Compton is being seen today for her first obstetrical visit.  This is not a planned pregnancy. She is at [redacted]w[redacted]d gestation. Her obstetrical history is significant for none. Relationship with FOB: significant other, living together. Patient does intend to breast feed. Pregnancy history fully reviewed.  The information documented in the HPI was reviewed and verified.  Menstrual History: OB History    Gravida Para Term Preterm AB TAB SAB Ectopic Multiple Living   0 2       Patient's last menstrual period was 04/06/2014 (approximate).    Past Medical History  Diagnosis Date  . Medical history non-contributory     Past Surgical History  Procedure Laterality Date  . No past surgeries       (Not in a hospital admission) No Known Allergies  History  Substance Use Topics  . Smoking status: Former Smoker -- 0.25 packs/day for 4 years    Types: Cigarettes    Quit date: 05/27/2013  . Smokeless tobacco: Never Used  . Alcohol Use: No    Family History  Problem Relation Age of Onset  . Cancer Maternal Aunt   . Cancer Maternal Grandmother   . Kidney disease Mother      Review of Systems Constitutional: negative for weight loss Gastrointestinal: negative for vomiting Genitourinary:negative for genital lesions and vaginal discharge and dysuria Musculoskeletal:negative for back pain Behavioral/Psych: negative for abusive relationship, depression, illegal drug usage and tobacco use    Objective:    BP 121/82 mmHg  Pulse 83  Temp(Src) 98.3 F (36.8 C)  Wt 122 lb (55.339 kg)  LMP 04/06/2014 (Approximate) General Appearance:    Alert, cooperative, no distress, appears stated age  Head:    Normocephalic, without obvious abnormality, atraumatic  Eyes:    PERRL, conjunctiva/corneas clear, EOM's intact, fundi    benign, both eyes  Ears:    Normal TM's and external ear canals, both ears  Nose:   Nares normal, septum midline, mucosa normal, no  drainage    or sinus tenderness  Throat:   Lips, mucosa, and tongue normal; teeth and gums normal  Neck:   Supple, symmetrical, trachea midline, no adenopathy;    thyroid:  no enlargement/tenderness/nodules; no carotid   bruit or JVD  Back:     Symmetric, no curvature, ROM normal, no CVA tenderness  Lungs:     Clear to auscultation bilaterally, respirations unlabored  Chest Wall:    No tenderness or deformity   Heart:    Regular rate and rhythm, S1 and S2 normal, no murmur, rub   or gallop  Breast Exam:    No tenderness, masses, or nipple abnormality  Abdomen:     Soft, non-tender, bowel sounds active all four quadrants,    no masses, no organomegaly  Genitalia:    Normal female without lesion, discharge or tenderness  Extremities:   Extremities normal, atraumatic, no cyanosis or edema  Pulses:   2+ and symmetric all extremities  Skin:   Skin color, texture, turgor normal, no rashes or lesions  Lymph nodes:   Cervical, supraclavicular, and axillary nodes normal  Neurologic:   CNII-XII intact, normal strength, sensation and reflexes    throughout      Lab Review Urine pregnancy test Labs reviewed yes Radiologic studies reviewed no Assessment:    Pregnancy at [redacted]w[redacted]d weeks    Plan:      Prenatal vitamins.  Counseling provided regarding continued use of  seat belts, cessation of alcohol consumption, smoking or use of illicit drugs; infection precautions i.e., influenza/TDAP immunizations, toxoplasmosis,CMV, parvovirus, listeria and varicella; workplace safety, exercise during pregnancy; routine dental care, safe medications, sexual activity, hot tubs, saunas, pools, travel, caffeine use, fish and methlymercury, potential toxins, hair treatments, varicose veins Weight gain recommendations per IOM guidelines reviewed: underweight/BMI< 18.5--> gain 28 - 40 lbs; normal weight/BMI 18.5 - 24.9--> gain 25 - 35 lbs; overweight/BMI 25 - 29.9--> gain 15 - 25 lbs; obese/BMI >30->gain  11 - 20  lbs Problem list reviewed and updated. FIRST/CF mutation testing/NIPT/QUAD SCREEN/fragile X/Ashkenazi Jewish population testing/Spinal muscular atrophy discussed: requested. Role of ultrasound in pregnancy discussed; fetal survey: requested. Amniocentesis discussed: not indicated. VBAC calculator score: VBAC consent form provided Meds ordered this encounter  Medications  . Prenat-FeFmCb-DSS-FA-DHA w/o A (CITRANATAL HARMONY) 27-1-260 MG CAPS    Sig: Take 1 tablet by mouth daily before breakfast.    Dispense:  90 capsule    Refill:  3   Orders Placed This Encounter  Procedures  . Culture, OB Urine  . SureSwab, Vaginosis/Vaginitis Plus  . US OB Limited    Standing Status: Future     Number of Occurrences:      Standing Expiration Date: 09/16/2015    Order Specific Question:  Reason for Exam (SYMPTOM  OR DIAGNOSIS REQUIRED)    Answer:  unsure lmp    Order Specific Question:  [facility]    [facility]  . Obstetric panel  . HIV antibody  . Hemoglobinopathy evaluation  . Varicella zoster antibody, IgG  . Vit D  25 hydroxy (rtn osteoporosis monitoring)  . POCT urinalysis dipstick    Follow up in 2 weeks. Ultrasound ordered for dating.

## 2014-07-17 LAB — OBSTETRIC PANEL
Antibody Screen: NEGATIVE
BASOS ABS: 0.1 10*3/uL (ref 0.0–0.1)
BASOS PCT: 1 % (ref 0–1)
EOS ABS: 0.3 10*3/uL (ref 0.0–0.7)
Eosinophils Relative: 3 % (ref 0–5)
HCT: 34.4 % — ABNORMAL LOW (ref 36.0–46.0)
HEP B S AG: NEGATIVE
Hemoglobin: 12 g/dL (ref 12.0–15.0)
Lymphocytes Relative: 17 % (ref 12–46)
Lymphs Abs: 1.7 10*3/uL (ref 0.7–4.0)
MCH: 29.9 pg (ref 26.0–34.0)
MCHC: 34.9 g/dL (ref 30.0–36.0)
MCV: 85.8 fL (ref 78.0–100.0)
MONOS PCT: 7 % (ref 3–12)
MPV: 8.7 fL (ref 8.6–12.4)
Monocytes Absolute: 0.7 10*3/uL (ref 0.1–1.0)
NEUTROS ABS: 7.2 10*3/uL (ref 1.7–7.7)
Neutrophils Relative %: 72 % (ref 43–77)
PLATELETS: 296 10*3/uL (ref 150–400)
RBC: 4.01 MIL/uL (ref 3.87–5.11)
RDW: 14.3 % (ref 11.5–15.5)
Rh Type: POSITIVE
Rubella: 1.75 Index — ABNORMAL HIGH (ref <0.90)
WBC: 10 10*3/uL (ref 4.0–10.5)

## 2014-07-17 LAB — CULTURE, OB URINE
Colony Count: NO GROWTH
ORGANISM ID, BACTERIA: NO GROWTH

## 2014-07-17 LAB — PAP IG W/ RFLX HPV ASCU

## 2014-07-17 LAB — VARICELLA ZOSTER ANTIBODY, IGG: Varicella IgG: 1454 Index — ABNORMAL HIGH (ref <135.00)

## 2014-07-17 LAB — VITAMIN D 25 HYDROXY (VIT D DEFICIENCY, FRACTURES): Vit D, 25-Hydroxy: 27 ng/mL — ABNORMAL LOW (ref 30–100)

## 2014-07-18 LAB — HEMOGLOBINOPATHY EVALUATION
HGB A2 QUANT: 2.7 % (ref 2.2–3.2)
HGB A: 97.3 % (ref 96.8–97.8)
Hemoglobin Other: 0 %
Hgb F Quant: 0 % (ref 0.0–2.0)
Hgb S Quant: 0 %

## 2014-07-19 LAB — SURESWAB, VAGINOSIS/VAGINITIS PLUS
Atopobium vaginae: NOT DETECTED Log (cells/mL)
BV CATEGORY: UNDETERMINED — AB
C. ALBICANS, DNA: DETECTED — AB
C. GLABRATA, DNA: NOT DETECTED
C. TRACHOMATIS RNA, TMA: NOT DETECTED
C. parapsilosis, DNA: NOT DETECTED
C. tropicalis, DNA: NOT DETECTED
Gardnerella vaginalis: 6 Log (cells/mL)
LACTOBACILLUS SPECIES: 6.8 Log (cells/mL)
MEGASPHAERA SPECIES: NOT DETECTED Log (cells/mL)
N. gonorrhoeae RNA, TMA: NOT DETECTED
T. vaginalis RNA, QL TMA: NOT DETECTED

## 2014-07-21 ENCOUNTER — Ambulatory Visit (HOSPITAL_COMMUNITY)
Admission: RE | Admit: 2014-07-21 | Discharge: 2014-07-21 | Disposition: A | Discharge status: Home / Self Care | Payer: Medicaid Other | Source: Ambulatory Visit | Attending: Obstetrics | Admitting: Obstetrics

## 2014-07-21 ENCOUNTER — Other Ambulatory Visit: Payer: Self-pay | Admitting: Obstetrics

## 2014-07-21 DIAGNOSIS — Z36 Encounter for antenatal screening of mother: Secondary | ICD-10-CM | POA: Insufficient documentation

## 2014-07-21 DIAGNOSIS — Z3687 Encounter for antenatal screening for uncertain dates: Secondary | ICD-10-CM

## 2014-07-21 DIAGNOSIS — Z3A18 18 weeks gestation of pregnancy: Secondary | ICD-10-CM | POA: Insufficient documentation

## 2014-07-21 DIAGNOSIS — Z3689 Encounter for other specified antenatal screening: Secondary | ICD-10-CM | POA: Insufficient documentation

## 2014-07-30 ENCOUNTER — Encounter: Payer: Self-pay | Admitting: Obstetrics

## 2014-07-30 ENCOUNTER — Ambulatory Visit (INDEPENDENT_AMBULATORY_CARE_PROVIDER_SITE_OTHER): Payer: Medicaid Other | Admitting: Obstetrics

## 2014-07-30 VITALS — BP 122/76 | HR 79 | Temp 97.4°F | Wt 124.0 lb

## 2014-07-30 DIAGNOSIS — Z3492 Encounter for supervision of normal pregnancy, unspecified, second trimester: Secondary | ICD-10-CM

## 2014-07-30 DIAGNOSIS — Z3482 Encounter for supervision of other normal pregnancy, second trimester: Secondary | ICD-10-CM | POA: Diagnosis not present

## 2014-07-30 LAB — POCT URINALYSIS DIPSTICK
Bilirubin, UA: NEGATIVE
Blood, UA: NEGATIVE
GLUCOSE UA: NORMAL
Ketones, UA: NEGATIVE
Leukocytes, UA: NEGATIVE
Nitrite, UA: NEGATIVE
PH UA: 6
Protein, UA: NEGATIVE
Spec Grav, UA: 1.01
Urobilinogen, UA: NEGATIVE

## 2014-07-30 NOTE — Addendum Note (Signed)
Addended by: Clearnce HastenJOHNSON, LATIA on: 07/30/2014 04:17 PM   Modules accepted: Orders

## 2014-07-30 NOTE — Progress Notes (Addendum)
  Subjective:    Maria Compton is a 29 y.o. female being seen today for her obstetrical visit. She is at 296w2d gestation. Patient reports: no complaints.  Problem List Items Addressed This Visit    None    Visit Diagnoses    Encounter for supervision of other normal pregnancy in second trimester    -  Primary    Prenatal care in second trimester        Relevant Orders    AFP, Quad Screen      Patient Active Problem List   Diagnosis Date Noted  . Unsure of LMP (last menstrual period) as reason for ultrasound scan   . Encounter for fetal anatomic survey   . [redacted] weeks gestation of pregnancy   . Indication for care in labor or delivery 11/23/2013  . NSVD (normal spontaneous vaginal delivery) 11/23/2013  . Normal delivery 12/24/2012  . GERD without esophagitis 09/18/2012    Objective:     BP 122/76 mmHg  Pulse 79  Temp(Src) 97.4 F (36.3 C)  Wt 124 lb (56.246 kg)  LMP 04/06/2014 (Approximate) Uterine Size: Below umbilicus     Assessment:    Pregnancy @ 286w2d  weeks Doing well    Plan:    Problem list reviewed and updated. Labs reviewed.  Follow up in 4 weeks. FIRST/CF mutation testing/NIPT/QUAD SCREEN/fragile X/Ashkenazi Jewish population testing/Spinal muscular atrophy discussed: requested. Role of ultrasound in pregnancy discussed; fetal survey: requested. Amniocentesis discussed: not indicated.

## 2014-08-04 LAB — AFP, QUAD SCREEN
AFP: 49.7 ng/mL
Age Alone: 1:874 {titer}
CURR GEST AGE: 19.2 wks.days
Down Syndrome Scr Risk Est: 1:5870 {titer}
HCG, Total: 15.63 IU/mL
INH: 345 pg/mL
Interpretation-AFP: NEGATIVE
MOM FOR HCG: 0.6
MoM for AFP: 0.85
MoM for INH: 1.62
OPEN SPINA BIFIDA: NEGATIVE
Osb Risk: 1:22600 {titer}
TRI 18 SCR RISK EST: NEGATIVE
Trisomy 18 (Edward) Syndrome Interp.: 1:30100 {titer}
UE3 VALUE: 2.24 ng/mL
uE3 Mom: 1.38

## 2014-08-08 ENCOUNTER — Other Ambulatory Visit: Payer: Self-pay | Admitting: Certified Nurse Midwife

## 2014-08-27 ENCOUNTER — Encounter: Payer: Self-pay | Admitting: Obstetrics

## 2014-08-27 ENCOUNTER — Ambulatory Visit (INDEPENDENT_AMBULATORY_CARE_PROVIDER_SITE_OTHER): Payer: Medicaid Other | Admitting: Obstetrics

## 2014-08-27 VITALS — BP 117/71 | HR 96 | Temp 98.1°F | Wt 125.2 lb

## 2014-08-27 DIAGNOSIS — Z3482 Encounter for supervision of other normal pregnancy, second trimester: Secondary | ICD-10-CM

## 2014-08-27 LAB — POCT URINALYSIS DIPSTICK
Bilirubin, UA: NEGATIVE
Blood, UA: NEGATIVE
GLUCOSE UA: NORMAL
Ketones, UA: NEGATIVE
Leukocytes, UA: NEGATIVE
NITRITE UA: NEGATIVE
Spec Grav, UA: 1.015
Urobilinogen, UA: NEGATIVE
pH, UA: 6

## 2014-08-27 NOTE — Addendum Note (Signed)
Addended by: Clearnce Hasten on: 08/27/2014 04:37 PM   Modules accepted: Orders

## 2014-08-27 NOTE — Progress Notes (Signed)
Subjective:    Maria Compton is a 29 y.o. female being seen today for her obstetrical visit. She is at [redacted]w[redacted]d gestation. Patient reports: no complaints . Fetal movement: normal.  Problem List Items Addressed This Visit    None     Patient Active Problem List   Diagnosis Date Noted  . Unsure of LMP (last menstrual period) as reason for ultrasound scan   . Encounter for fetal anatomic survey   . [redacted] weeks gestation of pregnancy   . Indication for care in labor or delivery 11/23/2013  . NSVD (normal spontaneous vaginal delivery) 11/23/2013  . Normal delivery 12/24/2012  . GERD without esophagitis 09/18/2012   Objective:    BP 117/71 mmHg  Pulse 96  Temp(Src) 98.1 F (36.7 C)  Wt 125 lb 3.2 oz (56.79 kg)  LMP 04/06/2014 (Approximate) FHT: 140 BPM  Uterine Size: size equals dates     Assessment:    Pregnancy @ [redacted]w[redacted]d    Plan:    OBGCT: ordered for next visit.  Labs, problem list reviewed and updated 2 hr GTT planned Follow up in 4 weeks.

## 2014-09-24 ENCOUNTER — Other Ambulatory Visit: Payer: Medicaid Other

## 2014-09-24 ENCOUNTER — Encounter: Payer: Medicaid Other | Admitting: Obstetrics

## 2014-09-29 ENCOUNTER — Other Ambulatory Visit: Payer: Medicaid Other

## 2014-09-29 ENCOUNTER — Encounter: Payer: Self-pay | Admitting: Obstetrics

## 2014-09-29 ENCOUNTER — Ambulatory Visit (INDEPENDENT_AMBULATORY_CARE_PROVIDER_SITE_OTHER): Payer: Medicaid Other | Admitting: Obstetrics

## 2014-09-29 VITALS — BP 114/70 | HR 97 | Temp 98.2°F | Wt 132.0 lb

## 2014-09-29 DIAGNOSIS — Z3403 Encounter for supervision of normal first pregnancy, third trimester: Secondary | ICD-10-CM

## 2014-09-29 LAB — POCT URINALYSIS DIPSTICK
BILIRUBIN UA: NEGATIVE
Glucose, UA: NEGATIVE
KETONES UA: NEGATIVE
Leukocytes, UA: NEGATIVE
Nitrite, UA: NEGATIVE
PH UA: 6.5
Protein, UA: NEGATIVE
RBC UA: NEGATIVE
SPEC GRAV UA: 1.015
Urobilinogen, UA: NEGATIVE

## 2014-09-29 LAB — CBC
HEMATOCRIT: 34.8 % — AB (ref 36.0–46.0)
HEMOGLOBIN: 11.9 g/dL — AB (ref 12.0–15.0)
MCH: 29.6 pg (ref 26.0–34.0)
MCHC: 34.2 g/dL (ref 30.0–36.0)
MCV: 86.6 fL (ref 78.0–100.0)
MPV: 8.6 fL (ref 8.6–12.4)
Platelets: 229 10*3/uL (ref 150–400)
RBC: 4.02 MIL/uL (ref 3.87–5.11)
RDW: 14.6 % (ref 11.5–15.5)
WBC: 10.1 10*3/uL (ref 4.0–10.5)

## 2014-09-29 NOTE — Progress Notes (Signed)
Subjective:    Maria Compton is a 29 y.o. female being seen today for her obstetrical visit. She is at [redacted]w[redacted]d gestation. Patient reports no complaints. Fetal movement: normal.  Problem List Items Addressed This Visit    None    Visit Diagnoses    Encounter for supervision of normal first pregnancy in third trimester    -  Primary    Relevant Orders    POCT urinalysis dipstick (Completed)      Patient Active Problem List   Diagnosis Date Noted  . Unsure of LMP (last menstrual period) as reason for ultrasound scan   . Encounter for fetal anatomic survey   . [redacted] weeks gestation of pregnancy   . Indication for care in labor or delivery 11/23/2013  . NSVD (normal spontaneous vaginal delivery) 11/23/2013  . Normal delivery 12/24/2012  . GERD without esophagitis 09/18/2012   Objective:    BP 114/70 mmHg  Pulse 97  Temp(Src) 98.2 F (36.8 C)  Wt 132 lb (59.875 kg)  LMP 04/06/2014 (Approximate) FHT:  140 BPM  Uterine Size: size less than dates  Presentation: unsure     Assessment:    Pregnancy @ [redacted]w[redacted]d weeks   Plan:     labs reviewed, problem list updated Consent signed. GBS sent TDAP offered  Rhogam given for RH negative Pediatrician: discussed. Infant feeding: plans to breastfeed. Maternity leave: not discussed. Cigarette smoking: former smoker. Orders Placed This Encounter  Procedures  . POCT urinalysis dipstick   No orders of the defined types were placed in this encounter.   Follow up in 2 Weeks.

## 2014-09-29 NOTE — Addendum Note (Signed)
Addended by: Henriette Combs on: 09/29/2014 01:39 PM   Modules accepted: Orders

## 2014-09-30 LAB — GLUCOSE TOLERANCE, 2 HOURS W/ 1HR
GLUCOSE, FASTING: 69 mg/dL (ref 65–99)
Glucose, 1 hour: 76 mg/dL (ref 70–170)
Glucose, 2 hour: 65 mg/dL — ABNORMAL LOW (ref 70–139)

## 2014-09-30 LAB — RPR

## 2014-09-30 LAB — HIV ANTIBODY (ROUTINE TESTING W REFLEX): HIV 1&2 Ab, 4th Generation: NONREACTIVE

## 2014-10-15 ENCOUNTER — Ambulatory Visit (INDEPENDENT_AMBULATORY_CARE_PROVIDER_SITE_OTHER): Payer: Medicaid Other | Admitting: Obstetrics

## 2014-10-15 ENCOUNTER — Encounter: Payer: Self-pay | Admitting: Obstetrics

## 2014-10-15 VITALS — Wt 131.0 lb

## 2014-10-15 DIAGNOSIS — Z3483 Encounter for supervision of other normal pregnancy, third trimester: Secondary | ICD-10-CM

## 2014-10-15 LAB — POCT URINALYSIS DIPSTICK
Bilirubin, UA: NEGATIVE
Glucose, UA: NEGATIVE
Ketones, UA: NEGATIVE
Leukocytes, UA: NEGATIVE
NITRITE UA: NEGATIVE
PH UA: 6
PROTEIN UA: NEGATIVE
RBC UA: NEGATIVE
Spec Grav, UA: 1.01
UROBILINOGEN UA: NEGATIVE

## 2014-10-15 NOTE — Progress Notes (Signed)
Subjective:    Maria Compton is a 29 y.o. female being seen today for her obstetrical visit. She is at [redacted]w[redacted]d gestation. Patient reports no complaints. Fetal movement: normal.  Problem List Items Addressed This Visit    None    Visit Diagnoses    Encounter for supervision of other normal pregnancy in third trimester    -  Primary    Relevant Orders    POCT urinalysis dipstick (Completed)    SGA (small for gestational age)        Relevant Orders    US OB Comp + 14 Wk      Patient Active Problem List   Diagnosis Date Noted  . Unsure of LMP (last menstrual period) as reason for ultrasound scan   . Encounter for fetal anatomic survey   . [redacted] weeks gestation of pregnancy   . Indication for care in labor or delivery 11/23/2013  . NSVD (normal spontaneous vaginal delivery) 11/23/2013  . Normal delivery 12/24/2012  . GERD without esophagitis 09/18/2012   Objective:    Wt 131 lb (59.421 kg)  LMP 04/06/2014 (Approximate) FHT:  140 BPM  Uterine Size: size less than dates  Presentation: unsure     Assessment:    Pregnancy @ [redacted]w[redacted]d weeks    SGA  Plan:   Ultrasound ordered for size < dates   labs reviewed, problem list updated Consent signed. GBS sent TDAP offered  Rhogam given for RH negative Pediatrician: discussed. Infant feeding: plans to breastfeed. Maternity leave: discussed. Cigarette smoking: former smoker. Orders Placed This Encounter  Procedures  . US OB Comp + 14 Wk    Standing Status: Future     Number of Occurrences:      Standing Expiration Date: 12/15/2015    Order Specific Question:  Reason for Exam (SYMPTOM  OR DIAGNOSIS REQUIRED)    Answer:  SGA    Order Specific Question:  Preferred imaging location?    Answer:  [facility]  . POCT urinalysis dipstick   No orders of the defined types were placed in this encounter.   Follow up in 2 Weeks.

## 2014-10-24 ENCOUNTER — Ambulatory Visit (HOSPITAL_COMMUNITY)
Admission: RE | Admit: 2014-10-24 | Discharge: 2014-10-24 | Disposition: A | Discharge status: Home / Self Care | Payer: Medicaid Other | Source: Ambulatory Visit | Attending: Obstetrics | Admitting: Obstetrics

## 2014-10-24 DIAGNOSIS — Z3A00 Weeks of gestation of pregnancy not specified: Secondary | ICD-10-CM | POA: Insufficient documentation

## 2014-10-24 DIAGNOSIS — O365990 Maternal care for other known or suspected poor fetal growth, unspecified trimester, not applicable or unspecified: Secondary | ICD-10-CM | POA: Insufficient documentation

## 2014-10-29 ENCOUNTER — Encounter: Payer: Self-pay | Admitting: Obstetrics

## 2014-10-29 ENCOUNTER — Ambulatory Visit (INDEPENDENT_AMBULATORY_CARE_PROVIDER_SITE_OTHER): Payer: Medicaid Other | Admitting: Obstetrics

## 2014-10-29 VITALS — BP 115/71 | HR 91 | Wt 128.0 lb

## 2014-10-29 DIAGNOSIS — Z3483 Encounter for supervision of other normal pregnancy, third trimester: Secondary | ICD-10-CM

## 2014-10-29 LAB — POCT URINALYSIS DIPSTICK
Bilirubin, UA: NEGATIVE
Blood, UA: NEGATIVE
Glucose, UA: NEGATIVE
Ketones, UA: NEGATIVE
Nitrite, UA: NEGATIVE
Spec Grav, UA: 1.01
Urobilinogen, UA: NEGATIVE
pH, UA: 6.5

## 2014-10-29 NOTE — Progress Notes (Signed)
Pt states that she is doing well. 

## 2014-10-29 NOTE — Progress Notes (Signed)
Subjective:    Maria Compton is a 29 y.o. female being seen today for her obstetrical visit. She is at 7345w2d gestation. Patient reports no complaints. Fetal movement: normal.  Problem List Items Addressed This Visit    None     Patient Active Problem List   Diagnosis Date Noted  . Unsure of LMP (last menstrual period) as reason for ultrasound scan   . Encounter for fetal anatomic survey   . [redacted] weeks gestation of pregnancy   . Indication for care in labor or delivery 11/23/2013  . NSVD (normal spontaneous vaginal delivery) 11/23/2013  . Normal delivery 12/24/2012  . GERD without esophagitis 09/18/2012   Objective:    BP 115/71 mmHg  Pulse 91  Wt 128 lb (58.06 kg)  LMP 04/06/2014 (Approximate) FHT:  140 BPM  Uterine Size: size equals dates  Presentation: unsure     Assessment:    Pregnancy @ 7745w2d weeks   Plan:     labs reviewed, problem list updated Consent signed. GBS sent TDAP offered  Rhogam given for RH negative Pediatrician: discussed. Infant feeding: plans to breastfeed. Maternity leave: discussed. Cigarette smoking: former smoker. No orders of the defined types were placed in this encounter.   No orders of the defined types were placed in this encounter.   Follow up in 2 Weeks.

## 2014-10-29 NOTE — Addendum Note (Signed)
Addended by: Marya LandryFOSTER, Lanyia Jewel D on: 10/29/2014 05:44 PM   Modules accepted: Orders

## 2014-11-12 ENCOUNTER — Ambulatory Visit (INDEPENDENT_AMBULATORY_CARE_PROVIDER_SITE_OTHER): Payer: Medicaid Other | Admitting: Obstetrics

## 2014-11-12 VITALS — BP 113/78 | HR 101 | Temp 98.1°F | Wt 136.0 lb

## 2014-11-12 DIAGNOSIS — Z3483 Encounter for supervision of other normal pregnancy, third trimester: Secondary | ICD-10-CM

## 2014-11-12 LAB — POCT URINALYSIS DIPSTICK
Bilirubin, UA: NEGATIVE
Blood, UA: NEGATIVE
Glucose, UA: NEGATIVE
Ketones, UA: NEGATIVE
LEUKOCYTES UA: NEGATIVE
Nitrite, UA: NEGATIVE
PH UA: 6
PROTEIN UA: NEGATIVE
SPEC GRAV UA: 1.01
UROBILINOGEN UA: NEGATIVE

## 2014-11-12 NOTE — Progress Notes (Signed)
Patient doing well today.  No concerns.

## 2014-11-13 ENCOUNTER — Encounter: Payer: Self-pay | Admitting: Obstetrics

## 2014-11-13 NOTE — Progress Notes (Signed)
Subjective:    Maria Compton is a 29 y.o. female being seen today for her obstetrical visit. She is at 6264w3d gestation. Patient reports no complaints. Fetal movement: normal.  Problem List Items Addressed This Visit    None    Visit Diagnoses    Prenatal care, subsequent pregnancy, third trimester    -  Primary    Relevant Orders    POCT urinalysis dipstick (Completed)      Patient Active Problem List   Diagnosis Date Noted  . Unsure of LMP (last menstrual period) as reason for ultrasound scan   . Encounter for fetal anatomic survey   . [redacted] weeks gestation of pregnancy   . Indication for care in labor or delivery 11/23/2013  . NSVD (normal spontaneous vaginal delivery) 11/23/2013  . Normal delivery 12/24/2012  . GERD without esophagitis 09/18/2012   Objective:    BP 113/78 mmHg  Pulse 101  Temp(Src) 98.1 F (36.7 C)  Wt 136 lb (61.689 kg)  LMP 04/06/2014 (Approximate) FHT:  150 BPM  Uterine Size: size equals dates  Presentation: unsure     Assessment:    Pregnancy @ 4264w3d weeks   Plan:     labs reviewed, problem list updated Consent signed. GBS sent TDAP offered  Rhogam given for RH negative Pediatrician: discussed. Infant feeding: plans to breastfeed. Maternity leave: discussed. Cigarette smoking: never smoked. Orders Placed This Encounter  Procedures  . POCT urinalysis dipstick   No orders of the defined types were placed in this encounter.   Follow up in 2 Weeks.

## 2014-11-28 ENCOUNTER — Ambulatory Visit (INDEPENDENT_AMBULATORY_CARE_PROVIDER_SITE_OTHER): Payer: Medicaid Other | Admitting: Obstetrics

## 2014-11-28 VITALS — BP 119/80 | HR 88 | Temp 97.7°F | Wt 135.0 lb

## 2014-11-28 DIAGNOSIS — Z3483 Encounter for supervision of other normal pregnancy, third trimester: Secondary | ICD-10-CM

## 2014-11-28 LAB — POCT URINALYSIS DIPSTICK
Bilirubin, UA: NEGATIVE
GLUCOSE UA: NEGATIVE
Ketones, UA: NEGATIVE
LEUKOCYTES UA: NEGATIVE
NITRITE UA: NEGATIVE
PROTEIN UA: NEGATIVE
RBC UA: NEGATIVE
Spec Grav, UA: 1.01
UROBILINOGEN UA: NEGATIVE
pH, UA: 7

## 2014-11-29 ENCOUNTER — Encounter: Payer: Self-pay | Admitting: Obstetrics

## 2014-11-29 LAB — STREP B DNA PROBE: GBSP: NOT DETECTED

## 2014-11-29 NOTE — Progress Notes (Signed)
Subjective:    Maria Compton is a 29 y.o. female being seen today for her obstetrical visit. She is at 5262w5d gestation. Patient reports no complaints. Fetal movement: normal.  Problem List Items Addressed This Visit    None    Visit Diagnoses    Supervision of other normal pregnancy, antepartum, third trimester    -  Primary    Relevant Orders    POCT urinalysis dipstick (Completed)    Strep B DNA probe      Patient Active Problem List   Diagnosis Date Noted  . Unsure of LMP (last menstrual period) as reason for ultrasound scan   . Encounter for fetal anatomic survey   . [redacted] weeks gestation of pregnancy   . Indication for care in labor or delivery 11/23/2013  . NSVD (normal spontaneous vaginal delivery) 11/23/2013  . Normal delivery 12/24/2012  . GERD without esophagitis 09/18/2012   Objective:    BP 119/80 mmHg  Pulse 88  Temp(Src) 97.7 F (36.5 C)  Wt 135 lb (61.236 kg)  LMP 04/06/2014 (Approximate) FHT:  140 BPM  Uterine Size: size equals dates  Presentation: unsure     Assessment:    Pregnancy @ 5362w5d weeks   Plan:     labs reviewed, problem list updated Consent signed. GBS sent TDAP offered  Rhogam given for RH negative Pediatrician: discussed. Infant feeding: plans to breastfeed. Maternity leave: discussed. Cigarette smoking: former smoker. Orders Placed This Encounter  Procedures  . Strep B DNA probe  . POCT urinalysis dipstick   No orders of the defined types were placed in this encounter.   Follow up in 1 Week.

## 2014-12-02 ENCOUNTER — Ambulatory Visit (INDEPENDENT_AMBULATORY_CARE_PROVIDER_SITE_OTHER): Payer: Medicaid Other | Admitting: Obstetrics

## 2014-12-02 VITALS — BP 123/78 | HR 89 | Temp 98.0°F | Wt 135.0 lb

## 2014-12-02 DIAGNOSIS — Z3483 Encounter for supervision of other normal pregnancy, third trimester: Secondary | ICD-10-CM

## 2014-12-02 LAB — POCT URINALYSIS DIPSTICK
BILIRUBIN UA: NEGATIVE
Blood, UA: NEGATIVE
Glucose, UA: NEGATIVE
KETONES UA: NEGATIVE
LEUKOCYTES UA: NEGATIVE
Nitrite, UA: NEGATIVE
Protein, UA: NEGATIVE
SPEC GRAV UA: 1.015
Urobilinogen, UA: NEGATIVE
pH, UA: 7

## 2014-12-05 ENCOUNTER — Encounter: Payer: Self-pay | Admitting: Obstetrics

## 2014-12-05 NOTE — Progress Notes (Signed)
Subjective:    Maria Compton is a 29 y.o. female being seen today for her obstetrical visit. She is at 8835w4d gestation. Patient reports no complaints. Fetal movement: normal.  Problem List Items Addressed This Visit    None    Visit Diagnoses    Supervision of other normal pregnancy, antepartum, third trimester    -  Primary    Relevant Orders    POCT urinalysis dipstick (Completed)      Patient Active Problem List   Diagnosis Date Noted  . Unsure of LMP (last menstrual period) as reason for ultrasound scan   . Encounter for fetal anatomic survey   . [redacted] weeks gestation of pregnancy   . Indication for care in labor or delivery 11/23/2013  . NSVD (normal spontaneous vaginal delivery) 11/23/2013  . Normal delivery 12/24/2012  . GERD without esophagitis 09/18/2012    Objective:    BP 123/78 mmHg  Pulse 89  Temp(Src) 98 F (36.7 C)  Wt 135 lb (61.236 kg)  LMP 04/06/2014 (Approximate) FHT: 140 BPM  Uterine Size: size equals dates  Presentations: cephalic    Assessment:    Pregnancy @ 5335w4d weeks   Plan:   Plans for delivery: Vaginal anticipated; labs reviewed; problem list updated Counseling: Consent signed. Infant feeding: plans to breastfeed. Cigarette smoking: never smoked. L&D discussion: symptoms of labor, discussed when to call, discussed what number to call, anesthetic/analgesic options reviewed and delivering clinician:  plans no preference. Postpartum supports and preparation: circumcision discussed and contraception plans discussed.  Follow up in 1 Week.

## 2014-12-09 ENCOUNTER — Ambulatory Visit (INDEPENDENT_AMBULATORY_CARE_PROVIDER_SITE_OTHER): Payer: Medicaid Other | Admitting: Obstetrics

## 2014-12-09 ENCOUNTER — Other Ambulatory Visit: Payer: Self-pay | Admitting: Certified Nurse Midwife

## 2014-12-09 ENCOUNTER — Encounter: Payer: Self-pay | Admitting: Obstetrics

## 2014-12-09 VITALS — BP 123/82 | HR 101 | Wt 135.0 lb

## 2014-12-09 DIAGNOSIS — Z3483 Encounter for supervision of other normal pregnancy, third trimester: Secondary | ICD-10-CM

## 2014-12-09 LAB — POCT URINALYSIS DIPSTICK
BILIRUBIN UA: NEGATIVE
Blood, UA: NEGATIVE
Glucose, UA: NEGATIVE
Ketones, UA: NEGATIVE
LEUKOCYTES UA: NEGATIVE
Nitrite, UA: NEGATIVE
PROTEIN UA: NEGATIVE
Spec Grav, UA: <=1.005
Urobilinogen, UA: NEGATIVE
pH, UA: 6.5

## 2014-12-09 NOTE — Progress Notes (Signed)
Subjective:    Maria Compton is a 29 y.o. female being seen today for her obstetrical visit. She is at 4352w1d gestation. Patient reports no complaints. Fetal movement: normal.  Problem List Items Addressed This Visit    None    Visit Diagnoses    Encounter for supervision of other normal pregnancy in third trimester    -  Primary    Relevant Orders    POCT urinalysis dipstick (Completed)      Patient Active Problem List   Diagnosis Date Noted  . Unsure of LMP (last menstrual period) as reason for ultrasound scan   . Encounter for fetal anatomic survey   . [redacted] weeks gestation of pregnancy   . Indication for care in labor or delivery 11/23/2013  . NSVD (normal spontaneous vaginal delivery) 11/23/2013  . Normal delivery 12/24/2012  . GERD without esophagitis 09/18/2012    Objective:    BP 123/82 mmHg  Pulse 101  Wt 135 lb (61.236 kg)  LMP 04/06/2014 (Approximate) FHT: 140 BPM  Uterine Size: size equals dates  Presentations: unsure    Assessment:    Pregnancy @ 4252w1d weeks   Plan:   Plans for delivery: Vaginal anticipated; labs reviewed; problem list updated Counseling: Consent signed. Infant feeding: plans to breastfeed. Cigarette smoking: former smoker. L&D discussion: symptoms of labor, discussed when to call, discussed what number to call, anesthetic/analgesic options reviewed and delivering clinician:  plans no preference. Postpartum supports and preparation: circumcision discussed and contraception plans discussed.  Follow up in 1 Week.

## 2014-12-16 ENCOUNTER — Encounter: Payer: Self-pay | Admitting: Obstetrics

## 2014-12-16 ENCOUNTER — Ambulatory Visit (INDEPENDENT_AMBULATORY_CARE_PROVIDER_SITE_OTHER): Payer: Medicaid Other | Admitting: Obstetrics

## 2014-12-16 VITALS — BP 132/84 | HR 89 | Temp 98.1°F | Wt 136.0 lb

## 2014-12-16 DIAGNOSIS — Z3483 Encounter for supervision of other normal pregnancy, third trimester: Secondary | ICD-10-CM

## 2014-12-16 LAB — POCT URINALYSIS DIPSTICK
BILIRUBIN UA: NEGATIVE
Blood, UA: NEGATIVE
GLUCOSE UA: NEGATIVE
Ketones, UA: NEGATIVE
LEUKOCYTES UA: NEGATIVE
NITRITE UA: NEGATIVE
Protein, UA: NEGATIVE
Spec Grav, UA: <=1.005
UROBILINOGEN UA: NEGATIVE
pH, UA: 6

## 2014-12-16 NOTE — Progress Notes (Signed)
Subjective:    Maria Compton is a 29 y.o. female being seen today for her obstetrical visit. She is at 7118w1d gestation. Patient reports no complaints. Fetal movement: normal.  Problem List Items Addressed This Visit    None    Visit Diagnoses    Supervision of other normal pregnancy, antepartum, third trimester    -  Primary    Relevant Orders    POCT urinalysis dipstick (Completed)      Patient Active Problem List   Diagnosis Date Noted  . Unsure of LMP (last menstrual period) as reason for ultrasound scan   . Encounter for fetal anatomic survey   . [redacted] weeks gestation of pregnancy   . Indication for care in labor or delivery 11/23/2013  . NSVD (normal spontaneous vaginal delivery) 11/23/2013  . Normal delivery 12/24/2012  . GERD without esophagitis 09/18/2012    Objective:    BP 132/84 mmHg  Pulse 89  Temp(Src) 98.1 F (36.7 C)  Wt 136 lb (61.689 kg)  LMP 04/06/2014 (Approximate) FHT: 140 BPM  Uterine Size: size equals dates  Presentations: unsure  Pelvic Exam:              Dilation: 1cm       Effacement: 50%             Station:  -3    Consistency: soft            Position: posterior     Assessment:    Pregnancy @ 7418w1d weeks   Plan:   Plans for delivery: Vaginal anticipated; labs reviewed; problem list updated Counseling: Consent signed. Infant feeding: plans to breastfeed. Cigarette smoking: former smoker. L&D discussion: symptoms of labor, discussed when to call, discussed what number to call, anesthetic/analgesic options reviewed and delivering clinician:  plans no preference. Postpartum supports and preparation: circumcision discussed and contraception plans discussed.  Follow up in 1 Week.

## 2014-12-23 ENCOUNTER — Ambulatory Visit (INDEPENDENT_AMBULATORY_CARE_PROVIDER_SITE_OTHER): Payer: Medicaid Other | Admitting: Obstetrics

## 2014-12-23 ENCOUNTER — Inpatient Hospital Stay (HOSPITAL_COMMUNITY)
Admission: AD | Admit: 2014-12-23 | Discharge: 2014-12-26 | DRG: 775 | Disposition: A | Discharge status: Home / Self Care | Payer: Medicaid Other | Source: Ambulatory Visit | Attending: Obstetrics | Admitting: Obstetrics

## 2014-12-23 ENCOUNTER — Encounter: Payer: Self-pay | Admitting: Obstetrics

## 2014-12-23 ENCOUNTER — Encounter (HOSPITAL_COMMUNITY): Payer: Self-pay

## 2014-12-23 ENCOUNTER — Other Ambulatory Visit: Payer: Self-pay | Admitting: *Deleted

## 2014-12-23 VITALS — BP 119/84 | HR 96 | Wt 136.0 lb

## 2014-12-23 DIAGNOSIS — O480 Post-term pregnancy: Secondary | ICD-10-CM | POA: Diagnosis present

## 2014-12-23 DIAGNOSIS — Z3A40 40 weeks gestation of pregnancy: Secondary | ICD-10-CM | POA: Diagnosis not present

## 2014-12-23 DIAGNOSIS — O26893 Other specified pregnancy related conditions, third trimester: Secondary | ICD-10-CM

## 2014-12-23 DIAGNOSIS — R102 Pelvic and perineal pain unspecified side: Secondary | ICD-10-CM

## 2014-12-23 DIAGNOSIS — Z3493 Encounter for supervision of normal pregnancy, unspecified, third trimester: Secondary | ICD-10-CM | POA: Diagnosis not present

## 2014-12-23 DIAGNOSIS — Z3483 Encounter for supervision of other normal pregnancy, third trimester: Secondary | ICD-10-CM

## 2014-12-23 LAB — CBC
HEMATOCRIT: 32.2 % — AB (ref 36.0–46.0)
HEMOGLOBIN: 11.1 g/dL — AB (ref 12.0–15.0)
MCH: 28.4 pg (ref 26.0–34.0)
MCHC: 34.5 g/dL (ref 30.0–36.0)
MCV: 82.4 fL (ref 78.0–100.0)
PLATELETS: 258 10*3/uL (ref 150–400)
RBC: 3.91 MIL/uL (ref 3.87–5.11)
RDW: 14.2 % (ref 11.5–15.5)
WBC: 14.1 10*3/uL — AB (ref 4.0–10.5)

## 2014-12-23 LAB — POCT URINALYSIS DIPSTICK
BILIRUBIN UA: NEGATIVE
GLUCOSE UA: NEGATIVE
KETONES UA: NEGATIVE
Leukocytes, UA: NEGATIVE
Nitrite, UA: NEGATIVE
PROTEIN UA: NEGATIVE
RBC UA: NEGATIVE
SPEC GRAV UA: 1.01
Urobilinogen, UA: NEGATIVE
pH, UA: 6

## 2014-12-23 MED ORDER — ONDANSETRON HCL 4 MG/2ML IJ SOLN: 4.0000 mg | Freq: Four times a day (QID) | INTRAMUSCULAR | Status: DC | PRN

## 2014-12-23 MED ORDER — LIDOCAINE HCL (PF) 1 % IJ SOLN
30.0000 mL | INTRAMUSCULAR | Status: DC | PRN
  Filled 2014-12-23: qty 30

## 2014-12-23 MED ORDER — OXYTOCIN BOLUS FROM INFUSION: 500.0000 mL | INTRAVENOUS | Status: DC

## 2014-12-23 MED ORDER — BUTORPHANOL TARTRATE 1 MG/ML IJ SOLN: 1.0000 mg | INTRAMUSCULAR | Status: DC | PRN

## 2014-12-23 MED ORDER — LACTATED RINGERS IV SOLN
INTRAVENOUS | Status: DC
  Administered 2014-12-23 – 2014-12-24 (×2): via INTRAVENOUS

## 2014-12-23 MED ORDER — LACTATED RINGERS IV SOLN: 500.0000 mL | INTRAVENOUS | Status: DC | PRN

## 2014-12-23 MED ORDER — CITRIC ACID-SODIUM CITRATE 334-500 MG/5ML PO SOLN: 30.0000 mL | ORAL | Status: DC | PRN

## 2014-12-23 MED ORDER — OXYCODONE-ACETAMINOPHEN 5-325 MG PO TABS: 2.0000 | ORAL_TABLET | ORAL | Status: DC | PRN

## 2014-12-23 MED ORDER — ACETAMINOPHEN 325 MG PO TABS: 650.0000 mg | ORAL_TABLET | ORAL | Status: DC | PRN

## 2014-12-23 MED ORDER — OXYTOCIN 40 UNITS IN LACTATED RINGERS INFUSION - SIMPLE MED
62.5000 mL/h | INTRAVENOUS | Status: DC
  Administered 2014-12-24: 62.5 mL/h via INTRAVENOUS

## 2014-12-23 MED ORDER — OXYCODONE-ACETAMINOPHEN 5-325 MG PO TABS
1.0000 | ORAL_TABLET | ORAL | Status: DC | PRN
Start: 2014-12-23 — End: 2014-12-24

## 2014-12-23 MED ORDER — MISOPROSTOL 25 MCG QUARTER TABLET
25.0000 ug | ORAL_TABLET | ORAL | Status: DC | PRN
  Administered 2014-12-24 (×2): 25 ug via VAGINAL
  Filled 2014-12-23: qty 1
  Filled 2014-12-23 (×2): qty 0.25

## 2014-12-23 MED ORDER — HYDROXYZINE HCL 50 MG PO TABS
50.0000 mg | ORAL_TABLET | Freq: Four times a day (QID) | ORAL | Status: DC | PRN
  Filled 2014-12-23: qty 1

## 2014-12-23 MED ORDER — TERBUTALINE SULFATE 1 MG/ML IJ SOLN
0.2500 mg | Freq: Once | INTRAMUSCULAR | Status: DC | PRN
  Filled 2014-12-23: qty 1

## 2014-12-23 NOTE — H&P (Signed)
[name] is Compton 29 y.o. female presenting for severe pelvic pain and pressure. Maternal Medical History:  Reason for admission: Postdates, multiparity, severe pelvic pain and pressure.  Favorable cervix.  Contractions: Frequency: irregular.    Fetal activity: Perceived fetal activity is normal.   Last perceived fetal movement was within the past hour.    Prenatal complications: no prenatal complications   OB History    Gravida Para Term Preterm AB TAB SAB Ectopic Multiple Living   5 2 2  2     0 2     Past Medical History  Diagnosis Date  . Medical history non-contributory    Past Surgical History  Procedure Laterality Date  . No past surgeries     Family History: family history includes Cancer in her maternal aunt and maternal grandmother; Kidney disease in her mother. Social History:  reports that she quit smoking about 18 months ago. Her smoking use included Cigarettes. She has Compton 1 pack-year smoking history. She has never used smokeless tobacco. She reports that she does not drink alcohol or use illicit drugs.   Prenatal Transfer Tool  Maternal Diabetes: No Genetic Screening: Normal Maternal Ultrasounds/Referrals: Normal Fetal Ultrasounds or other Referrals:  None Maternal Substance Abuse:  No Significant Maternal Medications:  None Significant Maternal Lab Results:  Lab values include: Group B Strep negative Other Comments:  None  Review of Systems  All other systems reviewed and are negative.     Blood pressure 119/84, pulse 96, weight 136 lb (61.689 kg), last menstrual period 04/06/2014, unknown if currently breastfeeding. Maternal Exam:  Uterine Assessment: Contraction strength is mild.  Abdomen: Patient reports no abdominal tenderness. Fetal presentation: vertex  Introitus: Normal vulva. Normal vagina.  Pelvis: adequate for delivery.   Cervix: Cervix evaluated by digital exam.     Physical Exam  Nursing note and vitals reviewed. Constitutional: She  is oriented to person, place, and time. She appears well-developed and well-nourished.  HENT:  Head: Normocephalic and atraumatic.  Eyes: Conjunctivae are normal. Pupils are equal, round, and reactive to light.  Neck: Normal range of motion. Neck supple.  Cardiovascular: Normal rate and regular rhythm.   Respiratory: Effort normal and breath sounds normal.  GI: Soft. Bowel sounds are normal.  Genitourinary: Vagina normal and uterus normal.  Musculoskeletal: Normal range of motion.  Neurological: She is alert and oriented to person, place, and time.  Skin: Skin is warm and dry.  Psychiatric: She has Compton normal mood and affect. Her behavior is normal. Judgment and thought content normal.    Prenatal labs: ABO, Rh: Compton/POS/-- (07/06 1325) Antibody: NEG (07/06 1325) Rubella: 1.75 (07/06 1325) RPR: NON REAC (09/19 1345)  HBsAg: NEGATIVE (07/06 1325)  HIV: NONREACTIVE (09/19 1345)  GBS: NOT DETECTED (11/18 1044)   Assessment/Plan: 40.1 weeks.  Severe pelvic pain and pressure.  IOL scheduled.   Maria Compton 12/23/2014, 3:13 PM

## 2014-12-24 ENCOUNTER — Encounter: Payer: Self-pay | Admitting: Obstetrics

## 2014-12-24 ENCOUNTER — Encounter (HOSPITAL_COMMUNITY): Payer: Self-pay | Admitting: *Deleted

## 2014-12-24 DIAGNOSIS — Z3493 Encounter for supervision of normal pregnancy, unspecified, third trimester: Secondary | ICD-10-CM

## 2014-12-24 DIAGNOSIS — O480 Post-term pregnancy: Secondary | ICD-10-CM

## 2014-12-24 DIAGNOSIS — Z3A40 40 weeks gestation of pregnancy: Secondary | ICD-10-CM

## 2014-12-24 LAB — TYPE AND SCREEN
ABO/RH(D): A POS
Antibody Screen: NEGATIVE

## 2014-12-24 LAB — RPR: RPR: NONREACTIVE

## 2014-12-24 MED ORDER — OXYTOCIN 40 UNITS IN LACTATED RINGERS INFUSION - SIMPLE MED
1.0000 m[IU]/min | INTRAVENOUS | Status: DC
  Administered 2014-12-24: 2 m[IU]/min via INTRAVENOUS

## 2014-12-24 MED ORDER — ZOLPIDEM TARTRATE 5 MG PO TABS: 5.0000 mg | ORAL_TABLET | Freq: Every evening | ORAL | Status: DC | PRN

## 2014-12-24 MED ORDER — CITRANATAL HARMONY 27-1-260 MG PO CAPS: 1.0000 | ORAL_CAPSULE | Freq: Every day | ORAL | Status: DC

## 2014-12-24 MED ORDER — BENZOCAINE-MENTHOL 20-0.5 % EX AERO: 1.0000 "application " | INHALATION_SPRAY | CUTANEOUS | Status: DC | PRN

## 2014-12-24 MED ORDER — ONDANSETRON HCL 4 MG/2ML IJ SOLN: 4.0000 mg | INTRAMUSCULAR | Status: DC | PRN

## 2014-12-24 MED ORDER — SENNOSIDES-DOCUSATE SODIUM 8.6-50 MG PO TABS
2.0000 | ORAL_TABLET | ORAL | Status: DC
  Administered 2014-12-24 – 2014-12-25 (×2): 2 via ORAL
  Filled 2014-12-24 (×2): qty 2

## 2014-12-24 MED ORDER — LANOLIN HYDROUS EX OINT: TOPICAL_OINTMENT | CUTANEOUS | Status: DC | PRN

## 2014-12-24 MED ORDER — IBUPROFEN 600 MG PO TABS
600.0000 mg | ORAL_TABLET | Freq: Once | ORAL | Status: AC
  Administered 2014-12-24: 600 mg via ORAL
  Filled 2014-12-24: qty 1

## 2014-12-24 MED ORDER — WITCH HAZEL-GLYCERIN EX PADS: 1.0000 "application " | MEDICATED_PAD | CUTANEOUS | Status: DC | PRN

## 2014-12-24 MED ORDER — PRENATAL MULTIVITAMIN CH
1.0000 | ORAL_TABLET | Freq: Every day | ORAL | Status: DC
  Administered 2014-12-25 – 2014-12-26 (×2): 1 via ORAL
  Filled 2014-12-24 (×2): qty 1

## 2014-12-24 MED ORDER — ONDANSETRON HCL 4 MG PO TABS: 4.0000 mg | ORAL_TABLET | ORAL | Status: DC | PRN

## 2014-12-24 MED ORDER — IBUPROFEN 600 MG PO TABS
600.0000 mg | ORAL_TABLET | Freq: Four times a day (QID) | ORAL | Status: DC
  Administered 2014-12-24 – 2014-12-26 (×7): 600 mg via ORAL
  Filled 2014-12-24 (×7): qty 1

## 2014-12-24 MED ORDER — TERBUTALINE SULFATE 1 MG/ML IJ SOLN
0.2500 mg | Freq: Once | INTRAMUSCULAR | Status: DC | PRN
  Filled 2014-12-24: qty 1

## 2014-12-24 MED ORDER — OXYTOCIN 40 UNITS IN LACTATED RINGERS INFUSION - SIMPLE MED
62.5000 mL/h | INTRAVENOUS | Status: DC | PRN
Start: 2014-12-24 — End: 2014-12-26

## 2014-12-24 MED ORDER — TETANUS-DIPHTH-ACELL PERTUSSIS 5-2.5-18.5 LF-MCG/0.5 IM SUSP: 0.5000 mL | Freq: Once | INTRAMUSCULAR | Status: DC

## 2014-12-24 MED ORDER — ACETAMINOPHEN 325 MG PO TABS: 650.0000 mg | ORAL_TABLET | ORAL | Status: DC | PRN

## 2014-12-24 MED ORDER — OXYTOCIN 40 UNITS IN LACTATED RINGERS INFUSION - SIMPLE MED: 1.0000 m[IU]/min | INTRAVENOUS | Status: DC

## 2014-12-24 MED ORDER — OXYCODONE-ACETAMINOPHEN 5-325 MG PO TABS: 2.0000 | ORAL_TABLET | ORAL | Status: DC | PRN

## 2014-12-24 MED ORDER — SIMETHICONE 80 MG PO CHEW: 80.0000 mg | CHEWABLE_TABLET | ORAL | Status: DC | PRN

## 2014-12-24 MED ORDER — DIBUCAINE 1 % RE OINT: 1.0000 "application " | TOPICAL_OINTMENT | RECTAL | Status: DC | PRN

## 2014-12-24 MED ORDER — OXYCODONE-ACETAMINOPHEN 5-325 MG PO TABS: 1.0000 | ORAL_TABLET | ORAL | Status: DC | PRN

## 2014-12-24 MED ORDER — DIPHENHYDRAMINE HCL 25 MG PO CAPS: 25.0000 mg | ORAL_CAPSULE | Freq: Four times a day (QID) | ORAL | Status: DC | PRN

## 2014-12-24 NOTE — Progress Notes (Signed)
Delivery Note  This is a 29 year old G 5 P2 who was admitted for Not in labor. and post dates management. She progressed naturally with pitocin and foley bulb to the second stage of labor.  Precipitous delivery with Dr. Alvester MorinNewton at bedside.  Transfer of care from Dr. Alvester MorinNewton.  She delivered a viable infant female,over an intact perineum.  Infant placed on maternal abdomen by Dr. Alvester MorinNewton.  Delayed cord clamping was performed for 5 minutes.  Cord double clamped and cut.  Apgar scores were 8 and 9. The placenta delivered spontaneously, shultz, with a 3 vessel cord.  Inspection revealed none. The uterus was firm bleeding stable.  EBL was 200.    Placenta and umbilical artery blood gas were not sent.  There were no complications during the procedure.  Mom and baby skin to skin following delivery. Left in stable condition.  Orvilla Cornwallachelle Larone Kliethermes CNM

## 2014-12-24 NOTE — Progress Notes (Signed)
Subjective:    Maria Compton is a 29 y.o. female being seen today for her obstetrical visit. She is at 5076w2d gestation. Patient reports no complaints. Fetal movement: normal.  Problem List Items Addressed This Visit    None    Visit Diagnoses    Supervision of other normal pregnancy, antepartum, third trimester    -  Primary    Relevant Orders    POCT urinalysis dipstick (Completed)      Patient Active Problem List   Diagnosis Date Noted  . Pelvic pain affecting pregnancy in third trimester, antepartum 12/23/2014  . Post-dates pregnancy 12/23/2014  . Unsure of LMP (last menstrual period) as reason for ultrasound scan   . Encounter for fetal anatomic survey   . [redacted] weeks gestation of pregnancy   . Indication for care in labor or delivery 11/23/2013  . NSVD (normal spontaneous vaginal delivery) 11/23/2013  . Normal delivery 12/24/2012  . GERD without esophagitis 09/18/2012    Objective:    BP 119/84 mmHg  Pulse 96  Wt 136 lb (61.689 kg)  LMP 04/06/2014 (Approximate) FHT: 140 BPM  Uterine Size: size equals dates  Presentations: cephalic  Pelvic Exam:              Dilation: 2cm       Effacement: 50%             Station:  -3    Consistency: soft            Position: middle     Assessment:    Pregnancy @ 5476w2d weeks   Plan:   Plans for delivery: Vaginal anticipated; labs reviewed; problem list updated Counseling: Consent signed. Infant feeding: plans to breastfeed. Cigarette smoking: smokes 0.25 PPD. L&D discussion: symptoms of labor, discussed when to call, discussed what number to call, anesthetic/analgesic options reviewed and delivering clinician:  plans no preference. Postpartum supports and preparation: circumcision discussed and contraception plans discussed.  IOL 12-23-14.

## 2014-12-25 LAB — CBC
HCT: 31.6 % — ABNORMAL LOW (ref 36.0–46.0)
Hemoglobin: 10.5 g/dL — ABNORMAL LOW (ref 12.0–15.0)
MCH: 27.9 pg (ref 26.0–34.0)
MCHC: 33.2 g/dL (ref 30.0–36.0)
MCV: 83.8 fL (ref 78.0–100.0)
PLATELETS: 273 10*3/uL (ref 150–400)
RBC: 3.77 MIL/uL — ABNORMAL LOW (ref 3.87–5.11)
RDW: 14.3 % (ref 11.5–15.5)
WBC: 13.9 10*3/uL — ABNORMAL HIGH (ref 4.0–10.5)

## 2014-12-25 NOTE — Progress Notes (Signed)
Post Partum Day #1 Subjective: no complaints, up ad lib, voiding and tolerating PO.  Breastfeeding is going well.    Objective: Blood pressure 108/59, pulse 87, temperature 99 F (37.2 C), temperature source Oral, resp. rate 18, height 5\' 3"  (1.6 m), weight 136 lb (61.689 kg), last menstrual period 04/06/2014, SpO2 97 %, unknown if currently breastfeeding.  Physical Exam:  General: alert, cooperative and no distress Lochia: appropriate Uterine Fundus: firm Incision: none DVT Evaluation: No evidence of DVT seen on physical exam. No cords or calf tenderness. No significant calf/ankle edema.   Recent Labs  12/23/14 2320  HGB 11.1*  HCT 32.2*    Assessment/Plan: Plan for discharge tomorrow and Breastfeeding   LOS: 2 days   Roe CoombsRachelle A Dahlila Pfahler 12/25/2014, 4:43 AM

## 2014-12-25 NOTE — Progress Notes (Signed)
UR chart review completed.  

## 2014-12-25 NOTE — Lactation Note (Signed)
This note was copied from the chart of Maria Compton. Lactation Consultation Note  Experienced BF mother reports that BF is going well.  She plans to express some of her milk and use bottle once she is home. Hand expression taught with colostrum visible. Feeding cues reviewed as was expected output.  Aware of support groups and outpatient services. Asked mom to call for latch assessment every 8 hours.  Follow-up planned.  Patient Name: Maria Compton ZOXWR'UToday's Date: 12/25/2014 Reason for consult: Initial assessment   Maternal Data Has patient been taught Hand Expression?: Yes  Feeding Feeding Type: Breast Fed Length of feed: 30 min  LATCH Score/Interventions                      Lactation Tools Discussed/Used     Consult Status      Soyla DryerJoseph, Trayvond Viets 12/25/2014, 12:02 PM

## 2014-12-26 MED ORDER — WITCH HAZEL-GLYCERIN EX PADS: 1.0000 "application " | MEDICATED_PAD | CUTANEOUS | Status: DC | PRN

## 2014-12-26 MED ORDER — LANOLIN HYDROUS EX OINT
1.0000 | TOPICAL_OINTMENT | CUTANEOUS | Status: DC | PRN
Start: 2014-12-26 — End: 2016-07-05

## 2014-12-26 MED ORDER — SENNOSIDES-DOCUSATE SODIUM 8.6-50 MG PO TABS: 2.0000 | ORAL_TABLET | ORAL | Status: DC

## 2014-12-26 MED ORDER — DIBUCAINE 1 % RE OINT: 1.0000 "application " | TOPICAL_OINTMENT | RECTAL | Status: DC | PRN

## 2014-12-26 MED ORDER — BENZOCAINE-MENTHOL 20-0.5 % EX AERO: 1.0000 "application " | INHALATION_SPRAY | Freq: Four times a day (QID) | CUTANEOUS | Status: DC | PRN

## 2014-12-26 MED ORDER — IBUPROFEN 600 MG PO TABS: 600.0000 mg | ORAL_TABLET | Freq: Three times a day (TID) | ORAL | Status: DC | PRN

## 2014-12-26 MED ORDER — OXYCODONE-ACETAMINOPHEN 5-325 MG PO TABS: 2.0000 | ORAL_TABLET | ORAL | Status: DC | PRN

## 2014-12-26 NOTE — Discharge Summary (Signed)
Obstetric Discharge Summary Reason for Admission: onset of labor Prenatal Procedures: ultrasound Intrapartum Procedures: spontaneous vaginal delivery Postpartum Procedures: none Complications-Operative and Postpartum: none HEMOGLOBIN  Date Value Ref Range Status  12/25/2014 10.5* 12.0 - 15.0 g/dL Final   HCT  Date Value Ref Range Status  12/25/2014 31.6* 36.0 - 46.0 % Final    Physical Exam:  General: alert, cooperative and no distress Lochia: appropriate Uterine Fundus: firm Incision: none DVT Evaluation: No evidence of DVT seen on physical exam. No cords or calf tenderness. No significant calf/ankle edema.  Discharge Diagnoses: Term Pregnancy-delivered  Discharge Information: Date: 12/26/2014 Activity: pelvic rest Diet: routine Medications: PNV, Ibuprofen, Colace and Percocet Condition: stable Instructions: refer to practice specific booklet Discharge to: home   Newborn Data: Live born female  Birth Weight: 7 lb 9 oz (3430 g) APGAR: 8, 9  Home with mother.  Murrel Freet A Ariv Penrod 12/26/2014, 8:58 AM

## 2014-12-26 NOTE — Lactation Note (Signed)
This note was copied from the chart of Maria Compton. Lactation Consultation Note  Patient Name: Maria Hunt OrisRobin Murdaugh QMVHQ'IToday's Date: 12/26/2014 Reason for consult: Follow-up assessment Baby at 43 hr of life and set for d/c today. Mom reports that at some feedings baby is not getting a deep enough latch. Baby does have a recessed chin, no oral assessment was done because mom reported that the baby's mouth was already checked. Suggested that mom try football hold in a reclined position and maintain neck support for the baby for the entire feeding. Discussed waking a sleepy baby at the breast. Mom requested a Harmony because she feels likes at times baby does not drain the breast well. Went over feeding frequency, baby belly size, and milk handling. Mom is to feed baby 8+/24hr on demand, pump as needed, and f/u with milk as needed. Offered latch help and an OP apt but mom declined. [facility]. She will call as needed for bf help.     Maternal Data    Feeding Feeding Type: Breast Fed Length of feed: 15 min  LATCH Score/Interventions Latch: Grasps breast easily, tongue down, lips flanged, rhythmical sucking.  Audible Swallowing: A few with stimulation  Type of Nipple: Everted at rest and after stimulation  Comfort (Breast/Nipple): Filling, red/small blisters or bruises, mild/mod discomfort     Hold (Positioning): No assistance needed to correctly position infant at breast.  LATCH Score: 8  Lactation Tools [facility]iscussed/[facility] Pump Review: Setup, frequency, and cleaning;Milk Storage Initiated by:: ES Date initiated:: 12/26/14   Consult Status Consult Status: Complete    Rulon Eisenmengerlizabeth E Sotero Brinkmeyer 12/26/2014, 12:55 PM

## 2014-12-26 NOTE — Discharge Instructions (Signed)

## 2016-01-21 IMAGING — US US OB COMP +14 WK
1 series · 12 of 28 positions shown · non-contrast
Comparison: none

[Series 1: us ob comp +14 wk · 12 of 96 slices shown]
[im 4/96]
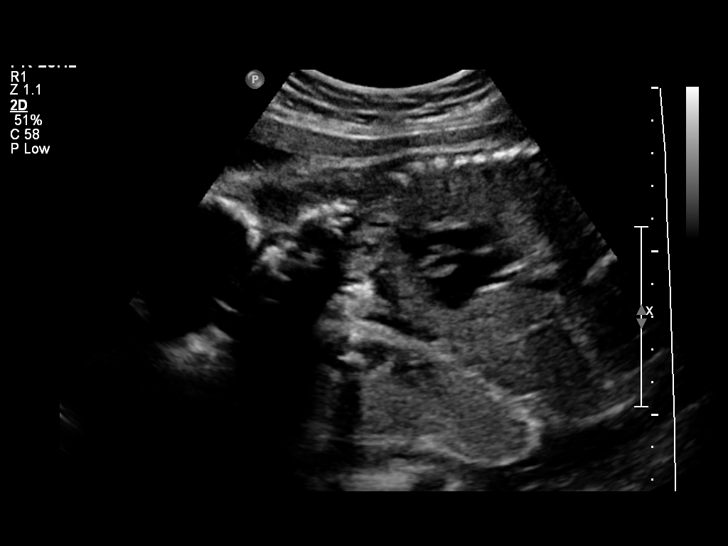
[im 11/96]
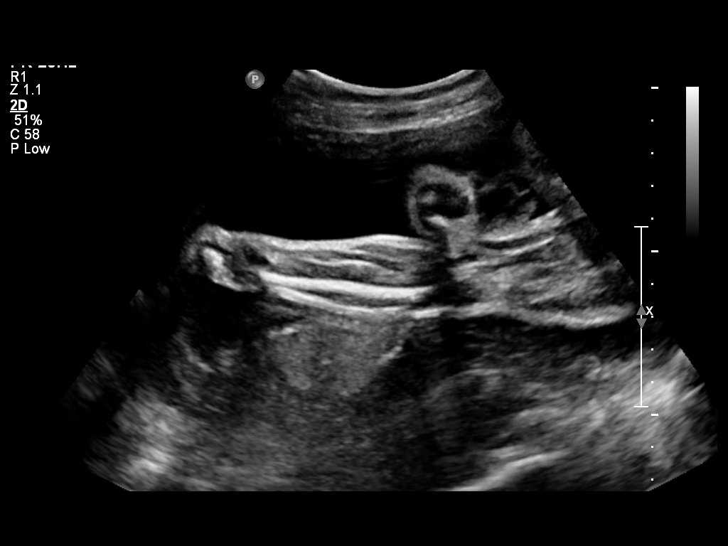
[im 18/96]
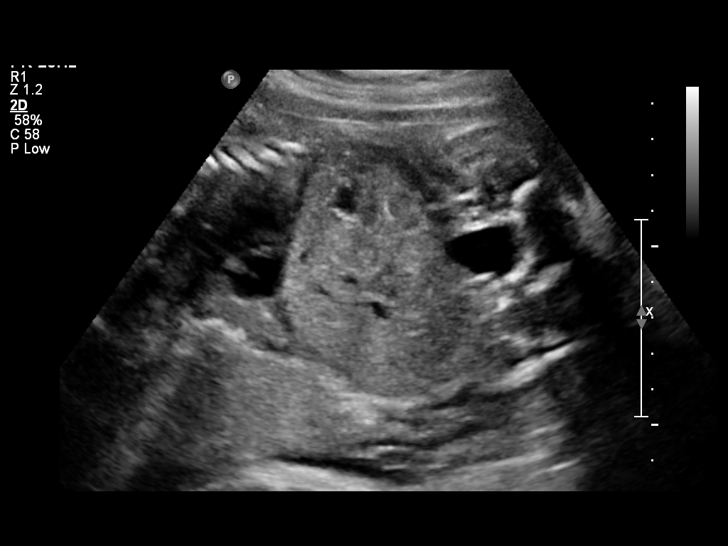
[im 29/96]
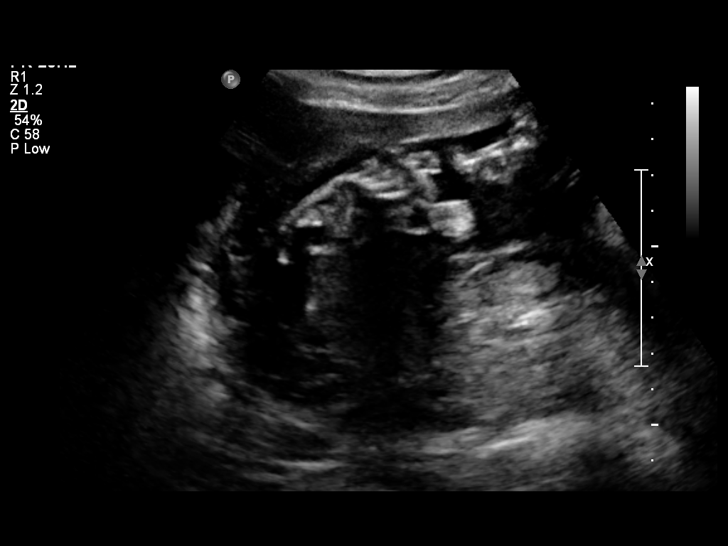
[im 36/96]
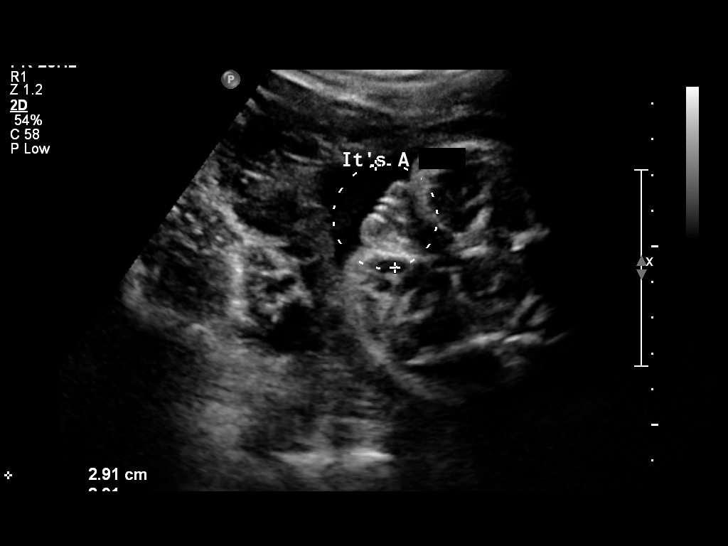
[im 43/96]
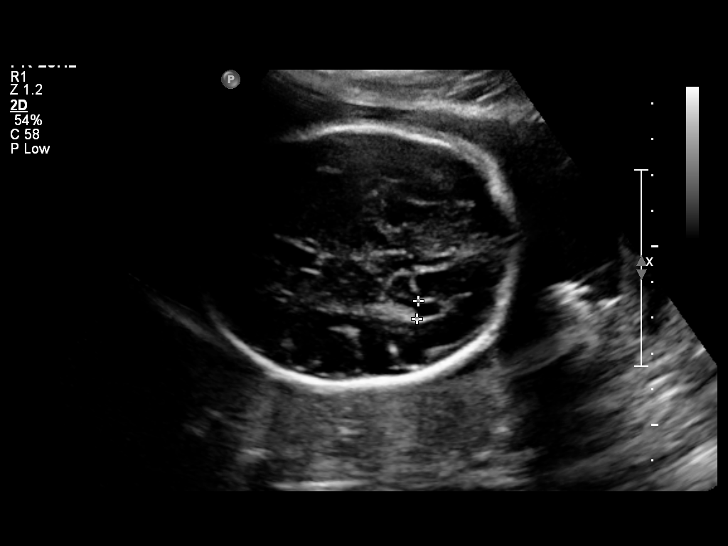
[im 53/96]
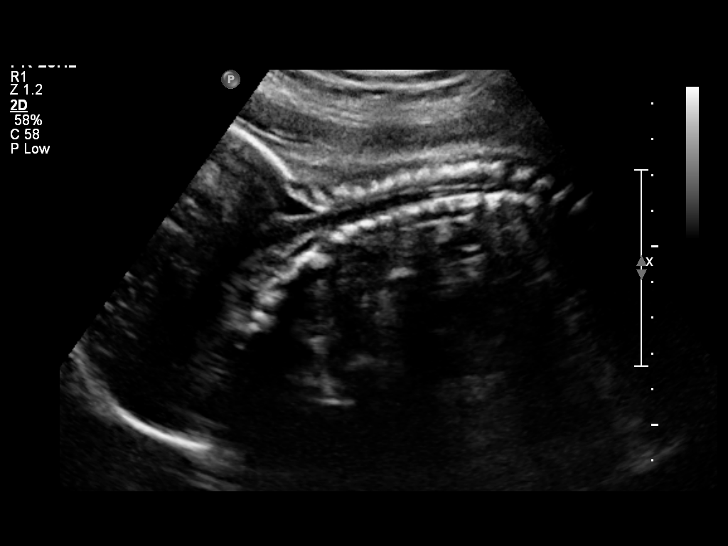
[im 60/96]
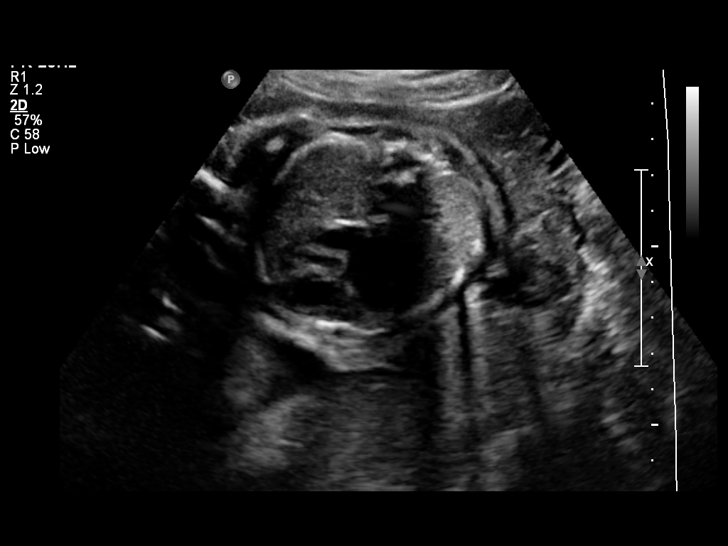
[im 67/96]
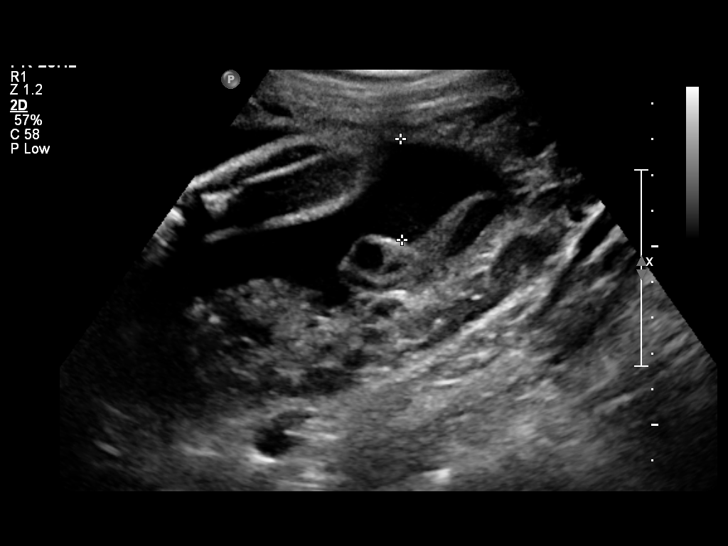
[im 78/96]
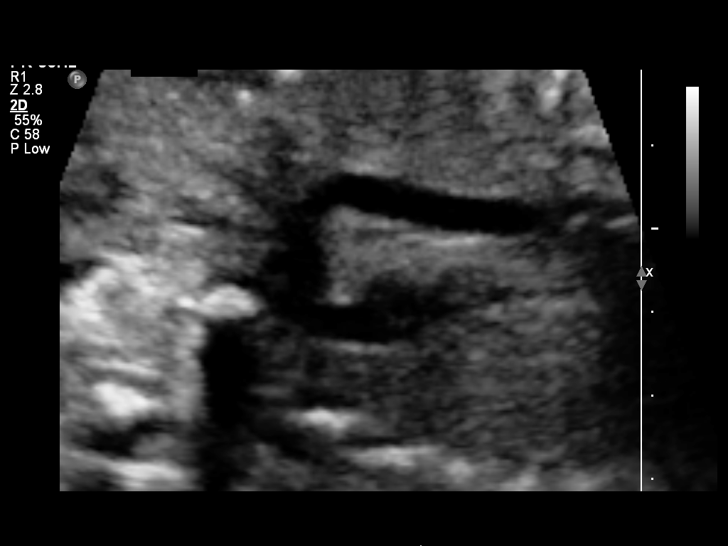
[im 85/96]
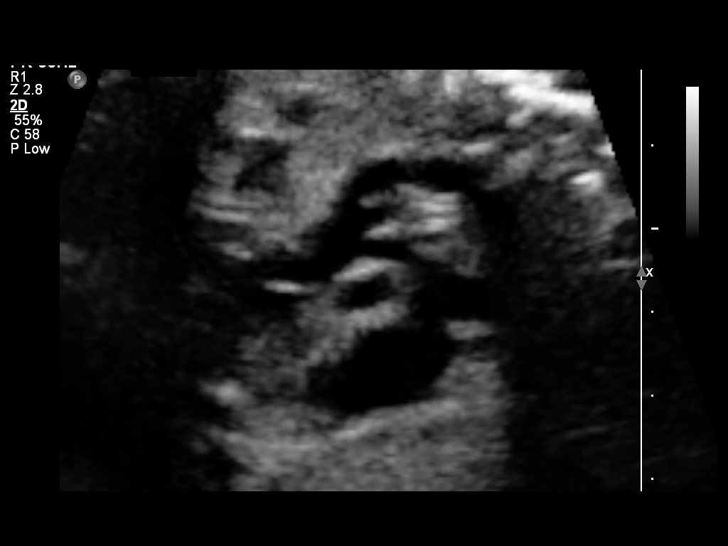
[im 92/96]
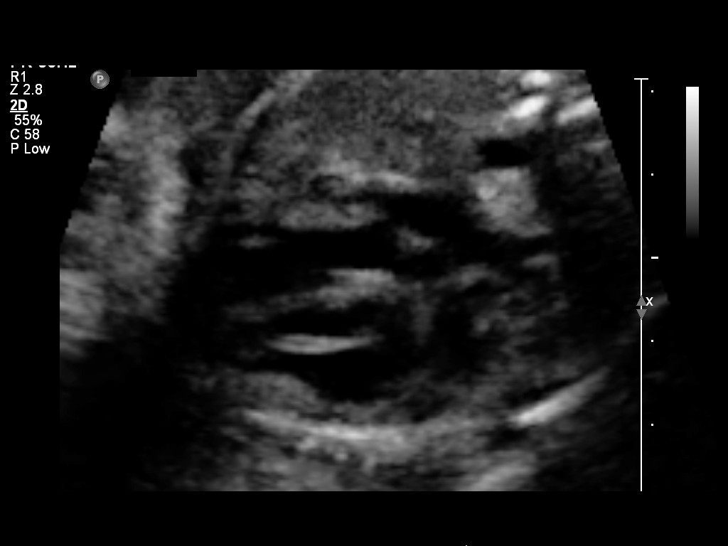

[12 of 28 positions shown; findings below may reference images not displayed]

OBSTETRICS REPORT
                      (Signed Final 09/10/2013 [DATE])

Service(s) Provided

 US OB COMP + 14 WK                                    76805.1
Indications

 Basic anatomic survey
Fetal Evaluation

 Num Of Fetuses:    1
 Cardiac Activity:  Observed
 Presentation:      Breech
 Placenta:          Posterior, above cervical
                    os
 P. Cord            Not well visualized
 Insertion:

 Amniotic Fluid
 AFI FV:      Subjectively within normal limits
 AFI Sum:     12.27   cm       30  %Tile
Biometry

 BPD:     70.4  mm     G. Age:  28w 2d                CI:        71.14   70 - 86
                                                      FL/HC:      20.6   18.8 -

 HC:     265.9  mm     G. Age:  29w 0d       34  %    HC/AC:      1.07   1.05 -

 AC:     249.6  mm     G. Age:  29w 1d       65  %    FL/BPD:     78.0   71 - 87
 FL:      54.9  mm     G. Age:  29w 0d       51  %    FL/AC:      22.0   20 - 24
 HUM:     48.4  mm     G. Age:  28w 3d       45  %
 CER:     31.1  mm     G. Age:  27w 1d       30  %

 Est. FW:    9373  gm    2 lb 15 oz      62  %
Gestational Age

 LMP:           30w 4d        Date:  02/08/13                 EDD:   11/15/13
 Clinical EDD:  28w 5d                                        EDD:   11/28/13
 U/S Today:     28w 6d                                        EDD:   11/27/13
 Best:          28w 3d     Det. By:  Previous Ultrasound      EDD:   11/30/13
                                     (07/03/13)
Anatomy

 Cranium:          Appears normal         Aortic Arch:      Appears normal
 Fetal Cavum:      Appears normal         Ductal Arch:      Appears normal
 Ventricles:       Appears normal         Diaphragm:        Appears normal
 Choroid Plexus:   Appears normal         Stomach:          Appears normal, left
                                                            sided
 Cerebellum:       Appears normal         Abdomen:          Appears normal
 Posterior Fossa:  Appears normal         Abdominal Wall:   Appears nml (cord
                                                            insert, abd wall)
 Nuchal Fold:      Not applicable (>20    Cord Vessels:     Appears normal (3
                   wks GA)                                  vessel cord)
 Face:             Appears normal         Kidneys:          Appear normal
                   (orbits and profile)
 Lips:             Appears normal         Bladder:          Appears normal
 Heart:            Appears normal         Spine:            Appears normal
                   (4CH, axis, and
                   situs)
 RVOT:             Appears normal         Lower             Appears normal
                                          Extremities:
 LVOT:             Appears normal         Upper             Appears normal
                                          Extremities:

 Other:  Nasal bone visualized. Heels visualized. Female gender. Technically
         difficult due to fetal position.
Cervix Uterus Adnexa

 Cervical Length:    4.8      cm

 Cervix:       Normal appearance by transabdominal scan.

 Left Ovary:    Not visualized.
 Right Ovary:   Within normal limits.
Impression

 SIUP at 28+3 weeks
 Normal detailed fetal anatomy
 Normal amniotic fluid volume
 Measurements consistent prior US; EFW at the 62nd %tile
Recommendations

 Follow-up as clinically indicated

## 2016-07-04 ENCOUNTER — Encounter (HOSPITAL_COMMUNITY): Payer: Self-pay | Admitting: *Deleted

## 2016-07-04 ENCOUNTER — Inpatient Hospital Stay (HOSPITAL_COMMUNITY)
Admission: AD | Admit: 2016-07-04 | Discharge: 2016-07-05 | Disposition: A | Discharge status: Home / Self Care | Payer: Self-pay | Source: Ambulatory Visit | Attending: Obstetrics and Gynecology | Admitting: Obstetrics and Gynecology

## 2016-07-04 DIAGNOSIS — N920 Excessive and frequent menstruation with regular cycle: Secondary | ICD-10-CM

## 2016-07-04 DIAGNOSIS — R109 Unspecified abdominal pain: Secondary | ICD-10-CM | POA: Insufficient documentation

## 2016-07-04 DIAGNOSIS — Z3201 Encounter for pregnancy test, result positive: Secondary | ICD-10-CM

## 2016-07-04 DIAGNOSIS — O99330 Smoking (tobacco) complicating pregnancy, unspecified trimester: Secondary | ICD-10-CM | POA: Insufficient documentation

## 2016-07-04 DIAGNOSIS — F17210 Nicotine dependence, cigarettes, uncomplicated: Secondary | ICD-10-CM | POA: Insufficient documentation

## 2016-07-04 DIAGNOSIS — O3680X0 Pregnancy with inconclusive fetal viability, not applicable or unspecified: Secondary | ICD-10-CM

## 2016-07-04 DIAGNOSIS — O26899 Other specified pregnancy related conditions, unspecified trimester: Secondary | ICD-10-CM | POA: Insufficient documentation

## 2016-07-04 DIAGNOSIS — Z3A00 Weeks of gestation of pregnancy not specified: Secondary | ICD-10-CM | POA: Insufficient documentation

## 2016-07-04 DIAGNOSIS — O209 Hemorrhage in early pregnancy, unspecified: Secondary | ICD-10-CM | POA: Insufficient documentation

## 2016-07-04 LAB — URINALYSIS, ROUTINE W REFLEX MICROSCOPIC
Bilirubin Urine: NEGATIVE
GLUCOSE, UA: NEGATIVE mg/dL
Ketones, ur: NEGATIVE mg/dL
Nitrite: NEGATIVE
Protein, ur: NEGATIVE mg/dL
SPECIFIC GRAVITY, URINE: 1.019 (ref 1.005–1.030)
pH: 7 (ref 5.0–8.0)

## 2016-07-04 LAB — POCT PREGNANCY, URINE: Preg Test, Ur: NEGATIVE

## 2016-07-04 NOTE — MAU Note (Signed)
Pt reports vaginal bleeding that started today while she was at work. States she also has some lower abdominal cramping. LMP: mid May. States she had 2 +upt at home earlier this month.

## 2016-07-04 NOTE — MAU Note (Signed)
PT  SAYS SHE HAS  STRONG CRAMPS  LIKE  CYCLE- STARTED SPOTTING  AT  WORK- WAS IN UNDERWEAR.  Marland Kitchen.  PAD ON- IN TRIAGE-  1 NICKEL SIZE BROWN SPOT.    NAUSEA,     DID  2 HPT- BOTH    POSITIVE.    NO BIRTH CONTROL.     LAST SEX-  SAT

## 2016-07-04 NOTE — MAU Note (Signed)
PT  SAYS  SHE HAS PASSED  A BLOOD CLOT - WHEN ASSESSED IN TRIAGE -  ANOTHER  BROWN SPOT ON  PAD

## 2016-07-05 ENCOUNTER — Inpatient Hospital Stay (HOSPITAL_COMMUNITY): Payer: Self-pay

## 2016-07-05 DIAGNOSIS — O26851 Spotting complicating pregnancy, first trimester: Secondary | ICD-10-CM

## 2016-07-05 LAB — CBC
HCT: 39.7 % (ref 36.0–46.0)
HEMOGLOBIN: 13.3 g/dL (ref 12.0–15.0)
MCH: 28.7 pg (ref 26.0–34.0)
MCHC: 33.5 g/dL (ref 30.0–36.0)
MCV: 85.7 fL (ref 78.0–100.0)
Platelets: 332 10*3/uL (ref 150–400)
RBC: 4.63 MIL/uL (ref 3.87–5.11)
RDW: 13 % (ref 11.5–15.5)
WBC: 9.4 10*3/uL (ref 4.0–10.5)

## 2016-07-05 LAB — ABO/RH: ABO/RH(D): A POS

## 2016-07-05 LAB — GC/CHLAMYDIA PROBE AMP (~~LOC~~) NOT AT ARMC
Chlamydia: NEGATIVE
Neisseria Gonorrhea: NEGATIVE

## 2016-07-05 LAB — WET PREP, GENITAL
Clue Cells Wet Prep HPF POC: NONE SEEN
SPERM: NONE SEEN
TRICH WET PREP: NONE SEEN
YEAST WET PREP: NONE SEEN

## 2016-07-05 LAB — HCG, QUANTITATIVE, PREGNANCY: HCG, BETA CHAIN, QUANT, S: 52 m[IU]/mL — AB (ref <5)

## 2016-07-05 NOTE — Discharge Instructions (Signed)
Vaginal Bleeding During Pregnancy, First Trimester °A small amount of bleeding (spotting) from the vagina is common in early pregnancy. Sometimes the bleeding is normal and is not a problem, and sometimes it is a sign of something serious. Be sure to tell your doctor about any bleeding from your vagina right away. °Follow these instructions at home: °· Watch your condition for any changes. °· Follow your doctor's instructions about how active you can be. °· If you are on bed rest: °? You may need to stay in bed and only get up to use the bathroom. °? You may be allowed to do some activities. °? If you need help, make plans for someone to help you. °· Write down: °? The number of pads you use each day. °? How often you change pads. °? How soaked (saturated) your pads are. °· Do not use tampons. °· Do not douche. °· Do not have sex or orgasms until your doctor says it is okay. °· If you pass any tissue from your vagina, save the tissue so you can show it to your doctor. °· Only take medicines as told by your doctor. °· Do not take aspirin because it can make you bleed. °· Keep all follow-up visits as told by your doctor. °Contact a doctor if: °· You bleed from your vagina. °· You have cramps. °· You have labor pains. °· You have a fever that does not go away after you take medicine. °Get help right away if: °· You have very bad cramps in your back or belly (abdomen). °· You pass large clots or tissue from your vagina. °· You bleed more. °· You feel light-headed or weak. °· You pass out (faint). °· You have chills. °· You are leaking fluid or have a gush of fluid from your vagina. °· You pass out while pooping (having a bowel movement). °This information is not intended to replace advice given to you by your health care provider. Make sure you discuss any questions you have with your health care provider. °Document Released: 05/13/2013 Document Revised: 06/04/2015 Document Reviewed: 09/03/2012 °Elsevier Interactive  Patient Education © 2018 [employer] Inc[employer] ° °

## 2016-07-05 NOTE — MAU Provider Note (Signed)
Chief Complaint: Vaginal Bleeding   SUBJECTIVE HPI: Maria Compton is a 31 y.o. [id number] at Unknown who presents to [facility] reporting +HPT with vaginal bleeding.  Patient states before father's day she had a home positive pregnancy test. Since then, she has been fine until today, she noticed cramping and spotting (old brown blood) in her underwear today after work. She had a NEG UPT in MAU. She has had 2TABs and 2 early SABs and 3 NSVD at term. She denies any N/V, dizziness/lightheadedness, F/C.     Past Medical History:  Diagnosis Date  . Medical history non-contributory    OB History  Gravida Para Term Preterm AB Living  7 3 3   4 3   SAB TAB Ectopic Multiple Live Births    2   0 3    # Outcome Date GA Lbr Len/2nd Weight Sex Delivery Anes PTL Lv  7 Term 12/24/14 [redacted]w[redacted]d 05:47 / 00:03 7 lb 9 oz (3.43 kg) F Vag-Spont None  LIV     Birth Comments: none  6 Term 11/23/13 [redacted]w[redacted]d 11:36 / 00:22 7 lb 6.5 oz (3.359 kg) F Vag-Spont EPI  LIV  5 Term 12/24/12 [redacted]w[redacted]d 35:30 / 02:41 7 lb 13.2 oz (3.549 kg) M Vag-Spont EPI  LIV     Birth Comments: none  4 AB 06/11/10 [redacted]w[redacted]d         3 AB 10/10/08 [redacted]w[redacted]d         2 TAB           1 TAB              Past Surgical History:  Procedure Laterality Date  . DILATION AND EVACUATION    . NO PAST SURGERIES     Social History   Social History  . Marital status: Single    Spouse name: N/A  . Number of children: N/A  . Years of education: N/A   Occupational History  . Not on file.   Social History Main Topics  . Smoking status: Current Every Day Smoker    Packs/day: 0.25    Years: 4.00    Types: Cigarettes  . Smokeless tobacco: Never Used  . Alcohol use Yes     Comment: socially  . Drug use: No  . Sexual activity: Yes    Birth control/ protection: None   Other Topics Concern  . Not on file   Social History Narrative  . No narrative on file   No current facility-administered medications on file prior to encounter.    Current  Outpatient Prescriptions on File Prior to Encounter  Medication Sig Dispense Refill  . Prenat-FeFmCb-DSS-FA-DHA w/o A (CITRANATAL HARMONY) 27-1-260 MG CAPS Take 1 tablet by mouth daily before breakfast. 90 capsule 3   No Known Allergies  I have reviewed the past Medical Hx, Surgical Hx, Social Hx, Allergies and Medications.   REVIEW OF SYSTEMS  A comprehensive ROS was negative except per HPI.   OBJECTIVE Patient Vitals for the past 24 hrs:  BP Temp Temp src Pulse Resp Height Weight  07/05/16 0143 137/83 - - 76 - - -  07/04/16 2114 129/83 98.2 F (36.8 C) Oral 91 18 5\' 3"  (1.6 m) 120 lb 4 oz (54.5 kg)    PHYSICAL EXAM Constitutional: Well-developed, well-nourished female in no acute distress.  Cardiovascular: normal rate, rhythm, no murmurs Respiratory: normal rate and effort. CTAB GI: Abd soft, non-tender, non-distended. Pos BS x 4 MS: Extremities nontender, no edema, normal ROM Neurologic: Alert and oriented  x 4.  GU: Neg CVAT. SPECULUM EXAM: NEFG, old spotting blood noted on mucosa, cervix clean BIMANUAL: cervix closed, thick; uterus normal size, no adnexal tenderness or masses. No CMT.  LAB RESULTS Results for orders placed or performed during the hospital encounter of 07/04/16 (from the past 24 hour(s))  Urinalysis, Routine w reflex microscopic     Status: Abnormal   Collection Time: 07/04/16  9:16 PM  Result Value Ref Range   Color, Urine YELLOW YELLOW   APPearance CLOUDY (A) CLEAR   Specific Gravity, Urine 1.019 1.005 - 1.030   pH 7.0 5.0 - 8.0   Glucose, UA NEGATIVE NEGATIVE mg/dL   Hgb urine dipstick LARGE (A) NEGATIVE   Bilirubin Urine NEGATIVE NEGATIVE   Ketones, ur NEGATIVE NEGATIVE mg/dL   Protein, ur NEGATIVE NEGATIVE mg/dL   Nitrite NEGATIVE NEGATIVE   Leukocytes, UA MODERATE (A) NEGATIVE  Pregnancy, urine POC     Status: None   Collection Time: 07/04/16  9:21 PM  Result Value Ref Range   Preg Test, Ur NEGATIVE NEGATIVE  Wet prep, genital     Status:  Abnormal   Collection Time: 07/05/16 12:25 AM  Result Value Ref Range   Yeast Wet Prep HPF POC NONE SEEN NONE SEEN   Trich, Wet Prep NONE SEEN NONE SEEN   Clue Cells Wet Prep HPF POC NONE SEEN NONE SEEN   WBC, Wet Prep HPF POC MANY (A) NONE SEEN   Sperm NONE SEEN   hCG, quantitative, pregnancy     Status: Abnormal   Collection Time: 07/05/16 12:29 AM  Result Value Ref Range   hCG, Beta Chain, Quant, S 52 (H) <5 mIU/mL  CBC     Status: None   Collection Time: 07/05/16 12:29 AM  Result Value Ref Range   WBC 9.4 4.0 - 10.5 K/uL   RBC 4.63 3.87 - 5.11 MIL/uL   Hemoglobin 13.3 12.0 - 15.0 g/dL   HCT 33.2 95.1 - 88.4 %   MCV 85.7 78.0 - 100.0 fL   MCH 28.7 26.0 - 34.0 pg   MCHC 33.5 30.0 - 36.0 g/dL   RDW 16.6 06.3 - 01.6 %   Platelets 332 150 - 400 K/uL  ABO/Rh     Status: None   Collection Time: 07/05/16 12:29 AM  Result Value Ref Range   ABO/RH(D) A POS     IMAGING US Ob Comp Less 14 Wks  Result Date: 07/05/2016 CLINICAL DATA:  Abdominal pain with brownish discharge EXAM: OBSTETRIC <14 WK [place] AND TRANSVAGINAL OB US TECHNIQUE: Both transabdominal and transvaginal ultrasound examinations were performed for complete evaluation of the gestation as well as the maternal uterus, adnexal regions, and pelvic cul-de-sac. Transvaginal technique was performed to assess early pregnancy. COMPARISON:  None. FINDINGS: Intrauterine gestational sac: Not visualized Yolk sac:  Not visualized Embryo:  Not visualized Maternal uterus/adnexae: Bilateral ovaries are within normal limits. The right ovary measures 2.6 x 2.9 x 1.8 cm. The left ovary measures 1.6 x 2.8 x 1.8 cm. Trace free fluid in the cul-de-sac. IMPRESSION: 1. No intrauterine pregnancy is visualized. Findings consistent with pregnancy of unknown location of which considerations include intrauterine pregnancy too early to visualize, occult ectopic, and recent miscarriage 2. Trace free fluid in the cul-de-sac Electronically Signed   By: Jasmine Pang M.D.   On: 07/05/2016 02:54   US Ob Transvaginal  Result Date: 07/05/2016 CLINICAL DATA:  Abdominal pain with brownish discharge EXAM: OBSTETRIC <14 WK [place] AND TRANSVAGINAL OB US TECHNIQUE: Both  transabdominal and transvaginal ultrasound examinations were performed for complete evaluation of the gestation as well as the maternal uterus, adnexal regions, and pelvic cul-de-sac. Transvaginal technique was performed to assess early pregnancy. COMPARISON:  None. FINDINGS: Intrauterine gestational sac: Not visualized Yolk sac:  Not visualized Embryo:  Not visualized Maternal uterus/adnexae: Bilateral ovaries are within normal limits. The right ovary measures 2.6 x 2.9 x 1.8 cm. The left ovary measures 1.6 x 2.8 x 1.8 cm. Trace free fluid in the cul-de-sac. IMPRESSION: 1. No intrauterine pregnancy is visualized. Findings consistent with pregnancy of unknown location of which considerations include intrauterine pregnancy too early to visualize, occult ectopic, and recent miscarriage 2. Trace free fluid in the cul-de-sac Electronically Signed   By: Jasmine PangKim  Fujinaga M.D.   On: 07/05/2016 02:54    MAU COURSE ABO/Rh - A Pos CBC - Hb 13.3; WBC 9.4 BHCG - 52 UPT - NEG Wet prep - NEG (WBC) GC/CT - pending   MDM Plan of care reviewed with patient, including labs and tests ordered and medical treatment. Discussed with patient regarding test results, +BHCG could mean recent miscarriage vs ectopic vs early pregnancy. Strict return precautions given such as fever, heavy bleeding, severe pain, lightheadedness, etc. Plan for follow up stat BHCG in 2 days in the office.    ASSESSMENT 1. Positive blood pregnancy test   2. Spotting   3. Pregnancy of unknown anatomic location   4. Bleeding in early pregnancy   5. Abdominal pain affecting pregnancy     PLAN Discharge home in stable condition. F/U in 2 days for repeat BHCG in office Strict return precautions given, bleeding/fever/pain If appropriate rise in  BHCG, repeat US in 2 weeks  Follow-up Information    [facility]   Specialty:  Obstetrics and Gynecology Why:  Return to clinic in 2 days for repeat blood pregnancy test Contact information: [address] [place] [id number] [phone]         Allergies as of 07/05/2016   No Known Allergies     Medication List    STOP taking these medications   benzocaine-Menthol 20-0.5 % Aero Commonly known as:  DERMOPLAST   dibucaine 1 % Oint Commonly known as:  NUPERCAINAL   ibuprofen 600 MG tablet Commonly known as:  ADVIL,MOTRIN   lanolin Oint   oxyCODONE-acetaminophen 5-325 MG tablet Commonly known as:  PERCOCET/ROXICET   senna-docusate 8.6-50 MG tablet Commonly known as:  Senokot-S   witch hazel-glycerin pad Commonly known as:  TUCKS     TAKE these medications   CITRANATAL HARMONY 27-1-260 MG Caps Take 1 tablet by mouth daily before breakfast.        Jen MowElizabeth Omolara Carol, DO OB Fellow 07/05/2016 8:55 AM

## 2016-07-05 NOTE — MAU Note (Signed)
PT  WAS CALLED  BACK TO MAU FOR  U/S

## 2016-07-06 ENCOUNTER — Other Ambulatory Visit: Payer: Medicaid Other

## 2016-07-06 DIAGNOSIS — O3680X0 Pregnancy with inconclusive fetal viability, not applicable or unspecified: Secondary | ICD-10-CM

## 2016-07-07 LAB — BETA HCG QUANT (REF LAB): hCG Quant: 47 m[IU]/mL

## 2016-07-08 ENCOUNTER — Telehealth: Payer: Self-pay | Admitting: General Practice

## 2016-07-08 ENCOUNTER — Inpatient Hospital Stay (HOSPITAL_COMMUNITY)
Admission: AD | Admit: 2016-07-08 | Discharge: 2016-07-08 | Disposition: A | Discharge status: Home / Self Care | Payer: Medicaid Other | Source: Ambulatory Visit | Attending: Obstetrics & Gynecology | Admitting: Obstetrics & Gynecology

## 2016-07-08 ENCOUNTER — Encounter (HOSPITAL_COMMUNITY): Payer: Self-pay | Admitting: Student

## 2016-07-08 DIAGNOSIS — O0281 Inappropriate change in quantitative human chorionic gonadotropin (hCG) in early pregnancy: Secondary | ICD-10-CM

## 2016-07-08 DIAGNOSIS — O009 Unspecified ectopic pregnancy without intrauterine pregnancy: Secondary | ICD-10-CM | POA: Insufficient documentation

## 2016-07-08 DIAGNOSIS — F17210 Nicotine dependence, cigarettes, uncomplicated: Secondary | ICD-10-CM | POA: Insufficient documentation

## 2016-07-08 DIAGNOSIS — Z79899 Other long term (current) drug therapy: Secondary | ICD-10-CM | POA: Insufficient documentation

## 2016-07-08 DIAGNOSIS — Z809 Family history of malignant neoplasm, unspecified: Secondary | ICD-10-CM | POA: Insufficient documentation

## 2016-07-08 DIAGNOSIS — Z841 Family history of disorders of kidney and ureter: Secondary | ICD-10-CM | POA: Insufficient documentation

## 2016-07-08 LAB — CREATININE, SERUM
Creatinine, Ser: 0.87 mg/dL (ref 0.44–1.00)
GFR calc Af Amer: >60 mL/min (ref >60)
GFR calc non Af Amer: >60 mL/min (ref >60)

## 2016-07-08 LAB — HCG, QUANTITATIVE, PREGNANCY: hCG, Beta Chain, Quant, S: 44 m[IU]/mL — ABNORMAL HIGH (ref <5)

## 2016-07-08 LAB — BUN: BUN: 10 mg/dL (ref 6–20)

## 2016-07-08 LAB — AST: AST: 9 U/L — AB (ref 15–41)

## 2016-07-08 MED ORDER — METHOTREXATE INJECTION FOR WOMEN'S HOSPITAL
50.0000 mg/m2 | Freq: Once | INTRAMUSCULAR | Status: AC
  Administered 2016-07-08: 80 mg via INTRAMUSCULAR
  Filled 2016-07-08: qty 1.6

## 2016-07-08 MED ORDER — PROMETHAZINE HCL 25 MG PO TABS: 25.0000 mg | ORAL_TABLET | Freq: Four times a day (QID) | ORAL | 0 refills | Status: AC | PRN

## 2016-07-08 NOTE — MAU Note (Signed)
Pt presents to MAU for repeat BHCG. Denies pain or VB

## 2016-07-08 NOTE — Telephone Encounter (Signed)
Called and informed patient of results & need to return to MAU for repeat bhcg. Patient verbalized understanding to all & had no questions

## 2016-07-08 NOTE — MAU Provider Note (Signed)
History   [id number]   Chief Complaint  Patient presents with  . Labs Only    HPI Maria Compton is a 31 y.o. female [phone] here for follow-up BHCG.  Upon review of the records patient was first seen on 6/26 for abdominal pain & vaginal spotting. BHCG on that day was 52. Ultrasound showed no IUP or adnexal mass, trace free fluid.  GC/CT and wet prep were collected. Results were negative. Patient had f/u BHCG at [facility] the next day that was 47. Was told to come to MAU today for f/u BHCG. Denies abdominal pain or vaginal bleeding.    Patient's last menstrual period was 05/24/2016.  OB History  Gravida Para Term Preterm AB Living  _0 SAB TAB Ectopic Multiple Live Births    2   0 3    # Outcome Date GA Lbr Len/2nd Weight Sex Delivery Anes PTL Lv  7 Term 12/24/14 36w2d05:47 / 00:03 7 lb 9 oz (3.43 kg) F Vag-Spont None  LIV     Birth Comments: none  6 Term 11/23/13 370w0d1:36 / 00:22 7 lb 6.5 oz (3.359 kg) F Vag-Spont EPI  LIV  5 Term 12/24/12 4017w6d:30 / 02:41 7 lb 13.2 oz (3.549 kg) M Vag-Spont EPI  LIV     Birth Comments: none  4 AB 06/11/10 8w059w0d     3 AB 10/10/08 12w025w0d    2 TAB           1 TAB               Past Medical History:  Diagnosis Date  . Medical history non-contributory     Family History  Problem Relation Age of Onset  . Cancer Maternal Aunt   . Cancer Maternal Grandmother   . Kidney disease Mother     Social History   Social History  . Marital status: Single    Spouse name: N/A  . Number of children: N/A  . Years of education: N/A   Social History Main Topics  . Smoking status: Current Every Day Smoker    Packs/day: 0.25    Years: 4.00    Types: Cigarettes  . Smokeless tobacco: Never Used  . Alcohol use Yes     Comment: socially  . Drug use: No  . Sexual activity: Yes    Birth control/ protection: None   Other Topics Concern  . Not on file   Social History Narrative  . No narrative on file    No Known  Allergies  No current facility-administered medications on file prior to encounter.    Current Outpatient Prescriptions on File Prior to Encounter  Medication Sig Dispense Refill  . Prenat-FeFmCb-DSS-FA-DHA w/o A (CITRANATAL HARMONY) 27-1-260 MG CAPS Take 1 tablet by mouth daily before breakfast. 90 capsule 3     Physical Exam   Vitals:   07/08/16 1803  BP: 119/75  Pulse: 80  Resp: 18  Temp: 98.5 F (36.9 C)    Physical Exam  Nursing note and vitals reviewed. Constitutional: She is oriented to person, place, and time. She appears well-developed and well-nourished. No distress.  HENT:  Head: Normocephalic and atraumatic.  Eyes: Conjunctivae are normal. Right eye exhibits no discharge. Left eye exhibits no discharge. No scleral icterus.  Neck: Normal range of motion.  Respiratory: Effort normal and breath sounds normal. No respiratory distress. She has no wheezes.  GI: Soft.  There is no tenderness.  Neurological: She is alert and oriented to person, place, and time.  Skin: Skin is warm and dry. She is not diaphoretic.  Psychiatric: She has a normal mood and affect. Her behavior is normal. Judgment and thought content normal.    MAU Course  Procedures Results for orders placed or performed during the hospital encounter of 07/08/16 (from the past 48 hour(s))  hCG, quantitative, pregnancy     Status: Abnormal   Collection Time: 07/08/16  5:51 PM  Result Value Ref Range   hCG, Beta Chain, Quant, S 44 (H) <5 mIU/mL    Comment:          GEST. AGE      CONC.  (mIU/mL)   <=1 WEEK        5 - 50     2 WEEKS       50 - 500     3 WEEKS       100 - 10,000     4 WEEKS     1,000 - 30,000     5 WEEKS     3,500 - 115,000   6-8 WEEKS     12,000 - 270,000    12 WEEKS     15,000 - 220,000        FEMALE AND NON-PREGNANT FEMALE:     LESS THAN 5 mIU/mL   AST     Status: Abnormal   Collection Time: 07/08/16  5:51 PM  Result Value Ref Range   AST 9 (L) 15 - 41 U/L  BUN     Status: None    Collection Time: 07/08/16  5:51 PM  Result Value Ref Range   BUN 10 6 - 20 mg/dL  Creatinine, serum     Status: None   Collection Time: 07/08/16  5:51 PM  Result Value Ref Range   Creatinine, Ser 0.87 0.44 - 1.00 mg/dL   GFR calc non Af Amer >60 >60 mL/min   GFR calc Af Amer >60 >60 mL/min    Comment: (NOTE) The eGFR has been calculated using the CKD EPI equation. This calculation has not been validated in all clinical situations. eGFR's persistently <60 mL/min signify possible Chronic Kidney Disease.       MDM A positive VSS, NAD, pt asymptomatic BHCGs have plateaued & concerning for ectopic pregnancy. Reviewed labs with Dr. Harolyn Rutherford & will offer patient MTX for suspected ectopic pregnancy.  Discussed results & plan with patient. Offered MTX vs repeat BHCG in 48 hrs -- pt would like to receive MTX today.  MTX labs ordered The risks of methotrexate were reviewed including failure requiring repeat dosing or eventual surgery. She understands that methotrexate involves frequent return visits to monitor lab values and that she remains at risk of ectopic rupture until her beta is less than assay. ?The patient opts to proceed with methotrexate.  She has no history of hepatic or renal dysfunction, has normal BUN/Cr/LFT's/platelets.  She is felt to be reliable for follow-up. Side effects of photosensitivity & GI upset were discussed.  She knows to avoid direct sunlight and abstain from alcohol, aspirin and aspirin-like products for two weeks. She was counseled to discontinue any MVI with folic acid. ?She understands to follow up on D4 (Monday) and D7 (Thursday) for repeat BHCG and was given the instruction sheet. Strict ectopic precautions were reviewed, the patient knows to call with any abdominal pain, vomiting, fainting, or any concerns with her health.  Rh+, no Rhogam necessary.  Assessment and Plan  31 y.o. [id number] at Unknown wks Pregnancy Follow-up BHCG 1. Ectopic pregnancy,  unspecified location, unspecified whether intrauterine pregnancy present   2. Inappropriate change in quantitative hCG in early pregnancy    P: Discharge home F/u at [facility] Monday morning for day 4 BHCG Rx phenergan prn Take tylenol prn pain Discussed reasons to return to MAU Discussed meds & foods to avoid  Jorje Guild, NP 07/08/2016 6:56 PM

## 2016-07-08 NOTE — Telephone Encounter (Signed)
-----   Message from Tereso NewcomerUgonna A Anyanwu, MD sent at 07/07/2016  8:16 AM EDT ----- Plateauing HCG, concerning for abnormal/ectopic pregnancy. Recheck 07/08/16.  Please call to inform patient of results and recommendations.

## 2016-07-08 NOTE — Discharge Instructions (Signed)
Methotrexate Treatment for an Ectopic Pregnancy Methotrexate is a medicine that treats a pregnancy condition in which the fetus develops outside the uterus (ectopic pregnancy) by stopping the growth of the fertilized egg. It also helps your body absorb tissue from the egg. This takes between 2 weeks and 6 weeks. Most ectopic pregnancies can be successfully treated with methotrexate if they are detected early enough. Tell a health care provider about:  Any allergies you have.  All medicines you are taking, including vitamins, herbs, eye drops, creams, and over-the-counter medicines.  Any medical conditions you have. What are the risks? Generally, this is a safe treatment. However, problems can occur, including:  Nausea.  Vomiting.  Diarrhea.  Abdominal cramping.  Mouth sores.  Increased vaginal bleeding or spotting.  Swelling or irritation of the lining of your lungs (pneumonitis).  Failed treatment and continuation of the pregnancy.  Liver damage.  Hair loss.  There is still a risk of the ectopic pregnancy rupturing while using the methotrexate. What happens before the procedure? Before you take the medicine:  Liver tests, kidney tests, and a complete blood test are performed.  Blood tests are performed to measure the pregnancy hormone levels and to determine your blood type.  If you are Rh-negative and the father is Rh-positive or his Rh type is not known, you will be given a Rho (D) immune globulin shot.  What happens during the procedure? There are two methods that your health care provider may use to give you methotrexate.  One method involves a single dose or injection of the medicine.  Another method involves a series of doses given through several injections.  What happens after the procedure?  You may have some abdominal cramping, vaginal bleeding, and fatigue in the first few days after taking methotrexate.  Blood tests will be taken for several weeks to  check the pregnancy hormone levels. The blood tests are performed until there is no more pregnancy hormone detected in the blood. This information is not intended to replace advice given to you by your health care provider. Make sure you discuss any questions you have with your health care provider. Document Released: 12/21/2000 Document Revised: 06/04/2015 Document Reviewed: 10/15/2012 Elsevier Interactive Patient Education  2017 [employer]   Ectopic Pregnancy An ectopic pregnancy is when the fertilized egg attaches (implants) outside the uterus. Most ectopic pregnancies occur in one of the tubes where eggs travel from the ovary to the uterus (fallopian tubes), but the implanting can occur in other locations. In rare cases, ectopic pregnancies occur on the ovary, intestine, pelvis, abdomen, or cervix. In an ectopic pregnancy, the fertilized egg does not have the ability to develop into a normal, healthy baby. A ruptured ectopic pregnancy is one in which tearing or bursting of a fallopian tube causes internal bleeding. Often, there is intense lower abdominal pain, and vaginal bleeding sometimes occurs. Having an ectopic pregnancy can be life-threatening. If this dangerous condition is not treated, it can lead to blood loss, shock, or even death. What are the causes? The most common cause of this condition is damage to one of the fallopian tubes. A fallopian tube may be narrowed or blocked, and that keeps the fertilized egg from reaching the uterus. What increases the risk? This condition is more likely to develop in women of childbearing age who have different levels of risk. The levels of risk can be divided into three categories. High risk  You have gone through infertility treatment.  You have had an  ectopic pregnancy before.  You have had surgery on the fallopian tubes, or another surgical procedure, such as an abortion.  You have had surgery to have the fallopian tubes tied (tubal  ligation).  You have problems or diseases of the fallopian tubes.  You have been exposed to diethylstilbestrol (DES). This medicine was used until 1971, and it had effects on babies whose mothers took the medicine.  You become pregnant while using an IUD (intrauterine device) for birth control. Moderate risk  You have a history of infertility.  You have had an STI (sexually transmitted infection).  You have a history of pelvic inflammatory disease (PID).  You have scarring from endometriosis.  You have multiple sexual partners.  You smoke. Low risk  You have had pelvic surgery.  You use vaginal douches.  You became sexually active before age 89. What are the signs or symptoms? Common symptoms of this condition include normal pregnancy symptoms, such as missing a period, nausea, tiredness, abdominal pain, breast tenderness, and bleeding. However, ectopic pregnancy will have additional symptoms, such as:  Pain with intercourse.  Irregular vaginal bleeding or spotting.  Cramping or pain on one side or in the lower abdomen.  Fast heartbeat, low blood pressure, and sweating.  Passing out while having a bowel movement.  Symptoms of a ruptured ectopic pregnancy and internal bleeding may include:  Sudden, severe pain in the abdomen and pelvis.  Dizziness, weakness, light-headedness, or fainting.  Pain in the shoulder or neck area.  How is this diagnosed? This condition is diagnosed by:  A pelvic exam to locate pain or a mass in the abdomen.  A pregnancy test. This blood test checks for the presence as well as the specific level of pregnancy hormone in the bloodstream.  Ultrasound. This is performed if a pregnancy test is positive. In this test, a probe is inserted into the vagina. The probe will detect a fetus, possibly in a location other than the uterus.  Taking a sample of uterus tissue (dilation and curettage, or D&C).  Surgery to perform a visual exam of the  inside of the abdomen using a thin, lighted tube that has a tiny camera on the end (laparoscope).  Culdocentesis. This procedure involves inserting a needle at the top of the vagina, behind the uterus. If blood is present in this area, it may indicate that a fallopian tube is torn.  How is this treated? This condition is treated with medicine or surgery. Medicine  An injection of a medicine (methotrexate) may be given to cause the pregnancy tissue to be absorbed. This medicine may save your fallopian tube. It may be given if: ? The diagnosis is made early, with no signs of active bleeding. ? The fallopian tube has not ruptured. ? You are considered to be a good candidate for the medicine. Usually, pregnancy hormone blood levels are checked after methotrexate treatment. This is to be sure that the medicine is effective. It may take 4-6 weeks for the pregnancy to be absorbed. Most pregnancies will be absorbed by 3 weeks. Surgery  A laparoscope may be used to remove the pregnancy tissue.  If severe internal bleeding occurs, a larger cut (incision) may be made in the lower abdomen (laparotomy) to remove the fetus and placenta. This is done to stop the bleeding.  Part or all of the fallopian tube may be removed (salpingectomy) along with the fetus and placenta. The fallopian tube may also be repaired during the surgery.  In very rare circumstances,  removal of the uterus (hysterectomy) may be required.  After surgery, pregnancy hormone testing may be done to be sure that there is no pregnancy tissue left. Whether your treatment is medicine or surgery, you may receive a Rho (D) immune globulin shot to prevent problems with any future pregnancy. This shot may be given if:  You are Rh-negative and the baby's father is Rh-positive.  You are Rh-negative and you do not know the Rh type of the baby's father.  Follow these instructions at home:  Rest and limit your activity after the procedure for  as long as told by your health care provider.  Until your health care provider says that it is safe: ? Do not lift anything that is heavier than 10 lb (4.5 kg), or the limit that your health care provider tells you. ? Avoid physical exercise and any movement that requires effort (is strenuous).  To help prevent constipation: ? Eat a healthy diet that includes fruits, vegetables, and whole grains. ? Drink 6-8 glasses of water per day. Get help right away if:  You develop worsening pain that is not relieved by medicine.  You have: ? A fever or chills. ? Vaginal bleeding. ? Redness and swelling at the incision site. ? Nausea and vomiting.  You feel dizzy or weak.  You feel light-headed or you faint. This information is not intended to replace advice given to you by your health care provider. Make sure you discuss any questions you have with your health care provider. Document Released: 02/04/2004 Document Revised: 08/26/2015 Document Reviewed: 07/29/2015 Elsevier Interactive Patient Education  [employer].

## 2016-07-08 NOTE — MAU Note (Signed)
Pt in lobby waiting for MTX. MTX info sheet given. Aware waiting for pharmacy to bring MTX and will call pt back to Triage when med ready.

## 2016-07-08 NOTE — Progress Notes (Addendum)
[facility] for repeat labs and MAU before then for any concerns. Written and verbal d/c instructions given and understanding voiced. Significant other with pt and supportive. Pt cheerful

## 2016-07-11 ENCOUNTER — Telehealth: Payer: Self-pay | Admitting: General Practice

## 2016-07-11 ENCOUNTER — Ambulatory Visit: Payer: Self-pay | Admitting: General Practice

## 2016-07-11 DIAGNOSIS — O00109 Unspecified tubal pregnancy without intrauterine pregnancy: Secondary | ICD-10-CM

## 2016-07-11 LAB — HCG, QUANTITATIVE, PREGNANCY: hCG, Beta Chain, Quant, S: 34 m[IU]/mL — ABNORMAL HIGH (ref <5)

## 2016-07-11 NOTE — Telephone Encounter (Signed)
Called patient and informed her of bhcg results & need to follow up on Thursday as currently scheduled. Patient verbalized understanding and had no questions

## 2016-07-11 NOTE — Progress Notes (Signed)
Patient here for stat bhcg today for day #4 labs. Patient denies pain or bleeding. Discussed with patient that I will call her in a couple hours with her results. Patient verbalized understanding and provided work number [phone] for contact. Per Dr Adrian BlackwaterStinson, patient's bhcg levels have decreased slightly, patient should follow up on Thursday for day #7 labs. Will call patient with results.

## 2016-07-14 ENCOUNTER — Other Ambulatory Visit: Payer: Medicaid Other | Admitting: General Practice

## 2016-07-14 DIAGNOSIS — O00109 Unspecified tubal pregnancy without intrauterine pregnancy: Secondary | ICD-10-CM

## 2016-07-14 LAB — HCG, QUANTITATIVE, PREGNANCY: HCG, BETA CHAIN, QUANT, S: 20 m[IU]/mL — AB (ref <5)

## 2016-07-14 NOTE — Progress Notes (Signed)
Patient here for day #7 labs following MTX. Patient denies bleeding or pain. Patient will wait in lobby for results. Per Dr Erin FullingHarraway Smith, patient has had good drop in bhcg levels. Patient can follow up next week for repeat bhcg. Patient informed. Patient verbalized understanding & had no questions

## 2016-07-21 ENCOUNTER — Other Ambulatory Visit: Payer: Medicaid Other

## 2016-09-09 ENCOUNTER — Encounter (HOSPITAL_COMMUNITY): Payer: Self-pay | Admitting: *Deleted

## 2016-09-09 ENCOUNTER — Emergency Department (HOSPITAL_COMMUNITY)
Admission: EM | Admit: 2016-09-09 | Discharge: 2016-09-09 | Disposition: A | Discharge status: Home / Self Care | Payer: Medicaid Other | Attending: Emergency Medicine | Admitting: Emergency Medicine

## 2016-09-09 DIAGNOSIS — M549 Dorsalgia, unspecified: Secondary | ICD-10-CM | POA: Insufficient documentation

## 2016-09-09 DIAGNOSIS — F17210 Nicotine dependence, cigarettes, uncomplicated: Secondary | ICD-10-CM | POA: Insufficient documentation

## 2016-09-09 DIAGNOSIS — Z79899 Other long term (current) drug therapy: Secondary | ICD-10-CM | POA: Insufficient documentation

## 2016-09-09 DIAGNOSIS — M79621 Pain in right upper arm: Secondary | ICD-10-CM | POA: Insufficient documentation

## 2016-09-09 MED ORDER — METHOCARBAMOL 500 MG PO TABS: 500.0000 mg | ORAL_TABLET | Freq: Two times a day (BID) | ORAL | 0 refills | Status: AC | PRN

## 2016-09-09 NOTE — ED Provider Notes (Signed)
[facility]-EMERGENCY DEPT Provider Note   CSN: [id number] Arrival date & time: 09/09/16  4098     History   Chief Complaint Chief Complaint  Patient presents with  . Back Pain  . Arm Pain    HPI Lamiyah Schlotter is a 31 y.o. female.  Khamari Yousuf is a 31 y.o. Female who presents to the ED complaining of right upper back pain. She reports a knot in her muscle to her upper right back for the past three weeks. She reports over the past several days she's had pain that is worse when she moves her right upper arm. She reports taking naproxen at home with some relief of her symptoms. She denies any known injury or trauma to her back or shoulder. She denies any numbness, tingling or weakness. She denies fevers, loss of bladder control, history of IV drug use, rashes, history of cancer, trouble walking or chest pain.    The history is provided by the patient and medical records. No language interpreter was used.  Back Pain   Pertinent negatives include no chest pain, no fever, no numbness and no weakness.  Arm Pain  Pertinent negatives include no chest pain and no shortness of breath.    Past Medical History:  Diagnosis Date  . Medical history non-contributory     Patient Active Problem List   Diagnosis Date Noted  . Pelvic pain affecting pregnancy in third trimester, antepartum 12/23/2014  . Post-dates pregnancy 12/23/2014  . Unsure of LMP (last menstrual period) as reason for ultrasound scan   . Encounter for fetal anatomic survey   . [redacted] weeks gestation of pregnancy   . Indication for care in labor or delivery 11/23/2013  . NSVD (normal spontaneous vaginal delivery) 11/23/2013  . Normal delivery 12/24/2012  . GERD without esophagitis 09/18/2012    Past Surgical History:  Procedure Laterality Date  . DILATION AND EVACUATION      OB History    Gravida Para Term Preterm AB Living   7 3 3   4 3    SAB TAB Ectopic Multiple Live Births     2   0 3       Home Medications     Prior to Admission medications   Medication Sig Start Date End Date Taking? Authorizing Provider  methocarbamol (ROBAXIN) 500 MG tablet Take 1 tablet (500 mg total) by mouth 2 (two) times daily as needed for muscle spasms. 09/09/16   Everlene Farrier, PA-C  promethazine (PHENERGAN) 25 MG tablet Take 1 tablet (25 mg total) by mouth every 6 (six) hours as needed for nausea or vomiting. 07/08/16   Judeth Horn, NP    Family History Family History  Problem Relation Age of Onset  . Cancer Maternal Aunt   . Cancer Maternal Grandmother   . Kidney disease Mother     Social History Social History  Substance Use Topics  . Smoking status: Current Every Day Smoker    Packs/day: 0.25    Years: 4.00    Types: Cigarettes  . Smokeless tobacco: Never Used  . Alcohol use Yes     Comment: socially     Allergies   Patient has no known allergies.   Review of Systems Review of Systems  Constitutional: Negative for chills and fever.  Respiratory: Negative for shortness of breath.   Cardiovascular: Negative for chest pain.  Musculoskeletal: Positive for back pain. Negative for gait problem and neck pain.  Skin: Negative for rash and wound.  Neurological: Negative for  weakness and numbness.     Physical Exam Updated Vital Signs BP (!) 138/98 (BP Location: Left Arm)   Pulse 100   Temp 97.7 F (36.5 C) (Oral)   Resp 18   LMP 08/24/2016   SpO2 100%   Physical Exam  Constitutional: She appears well-developed and well-nourished. No distress.  HENT:  Head: Normocephalic and atraumatic.  Eyes: Right eye exhibits no discharge. Left eye exhibits no discharge.  Cardiovascular: Normal rate, regular rhythm and intact distal pulses.   Bilateral radial pulses are intact and equal.   Pulmonary/Chest: Effort normal. No respiratory distress.  Musculoskeletal: Normal range of motion. She exhibits tenderness. She exhibits no edema or deformity.  Mild tenderness along her right rhomboid major  musculature. No knots or masses felt. No midline neck or back TTP. Good ROM of her right shoulder and arm. No UE edema or TTP. No back erythema, ecchymosis or deformity.  Neurological: She is alert. No sensory deficit. Coordination normal.  Skin: Skin is warm and dry. Capillary refill takes less than 2 seconds. No rash noted. She is not diaphoretic. No erythema. No pallor.  Psychiatric: She has a normal mood and affect. Her behavior is normal.  Nursing note and vitals reviewed.    ED Treatments / Results  Labs (all labs ordered are listed, but only abnormal results are displayed) Labs Reviewed - No data to display  EKG  EKG Interpretation None       Radiology No results found.  Procedures Procedures (including critical care time)  Medications Ordered in ED Medications - No data to display   Initial Impression / Assessment and Plan / ED Course  I have reviewed the triage vital signs and the nursing notes.  Pertinent labs & imaging results that were available during my care of the patient were reviewed by me and considered in my medical decision making (see chart for details).    This is a 31 y.o. Female who presents to the ED complaining of right upper back pain. She reports a knot in her muscle to her upper right back for the past three weeks. She reports over the past several days she's had pain that is worse when she moves her right upper arm. She reports taking naproxen at home with some relief of her symptoms. She denies any known injury or trauma to her back or shoulder. She denies any numbness, tingling or weakness.  On exam the patient is afebrile nontoxic appearing. She is neurovascularly intact. She has some mild tenderness across her right upper rhomboid major musculature. No midline neck or back tenderness. No back erythema, deformity or ecchymosis. Good range of motion of her right upper extremity. Suspect muscular skeletal pain. She is taking naproxen at home. I  encouraged her to continue take this medication and offered her prescription for Robaxin as a muscle relaxer. I discussed back exercises as well as possibly even referral to see massage therapy or physical therapy. I advised the patient to follow-up with their primary care provider this week. I advised the patient to return to the emergency department with new or worsening symptoms or new concerns. The patient verbalized understanding and agreement with plan.     Final Clinical Impressions(s) / ED Diagnoses   Final diagnoses:  Upper back pain on right side    New Prescriptions New Prescriptions   METHOCARBAMOL (ROBAXIN) 500 MG TABLET    Take 1 tablet (500 mg total) by mouth 2 (two) times daily as needed for muscle spasms.  Everlene FarrierDansie, Reymundo Winship, PA-C 09/09/16 0940    Lorre NickAllen, Anthony, MD 09/09/16 [phone]

## 2016-09-09 NOTE — ED Notes (Signed)
C/o "knot in my back", right subscapular, x 3 weeks. Recently started going down right arm. 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

## 2016-09-09 NOTE — ED Triage Notes (Signed)
Pt reports having a "knot" on right side of her upper back for extended amount of time. Now has pain in her right shoulder and into her arm. Ambulatory at triage with no acute distress noted.

## 2018-11-15 IMAGING — US US OB COMP LESS 14 WK
1 series · 15 of 28 positions shown · non-contrast
Comparison: None.

CLINICAL DATA: Abdominal pain with brownish discharge

EXAM:
OBSTETRIC <14 WK US AND TRANSVAGINAL OB US
TECHNIQUE: Both transabdominal and transvaginal ultrasound examinations were
performed for complete evaluation of the gestation as well as the
maternal uterus, adnexal regions, and pelvic cul-de-sac.
Transvaginal technique was performed to assess early pregnancy.

[Series 1: us ob comp less 14 wk · 15 of 88 slices shown]
[im 1/88]
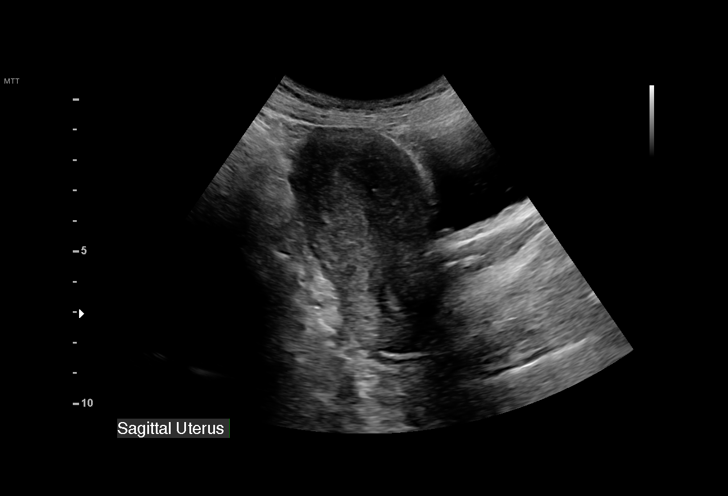
[im 7/88]
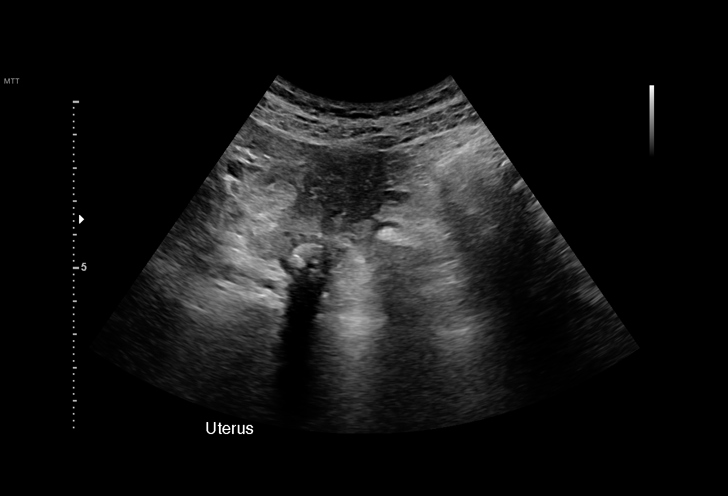
[im 13/88]
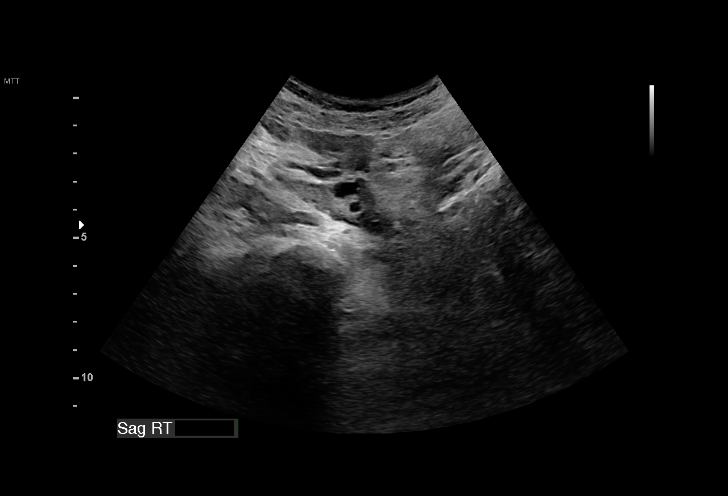
[im 20/88]
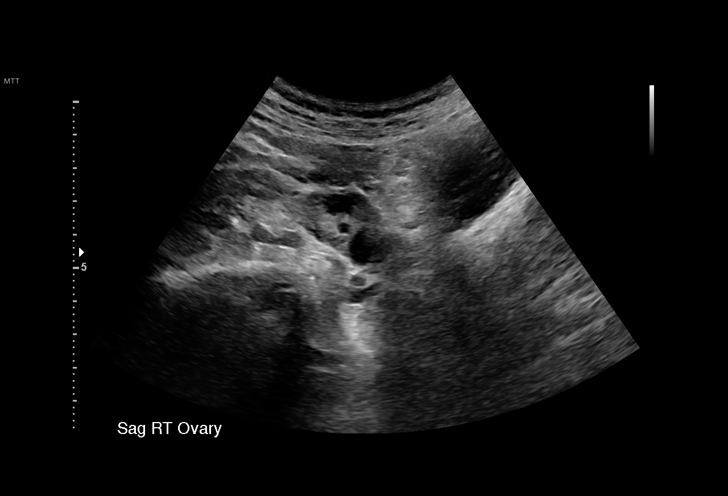
[im 26/88]
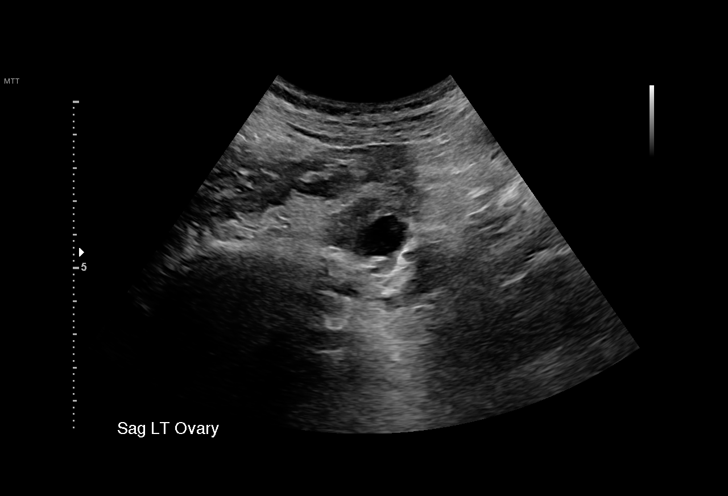
[im 33/88]
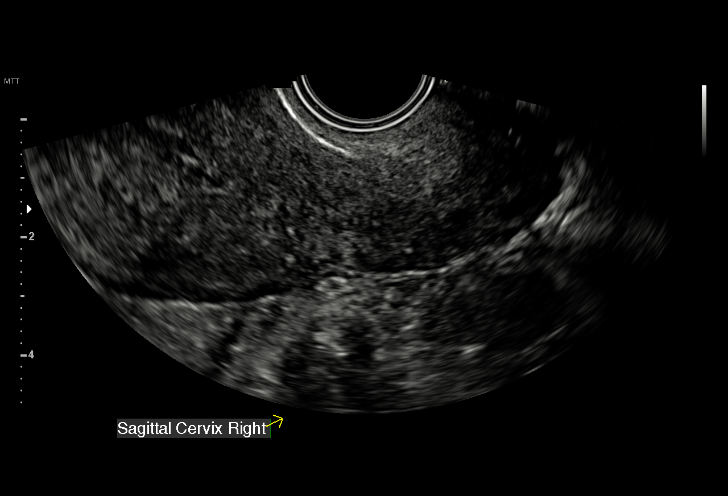
[im 39/88]
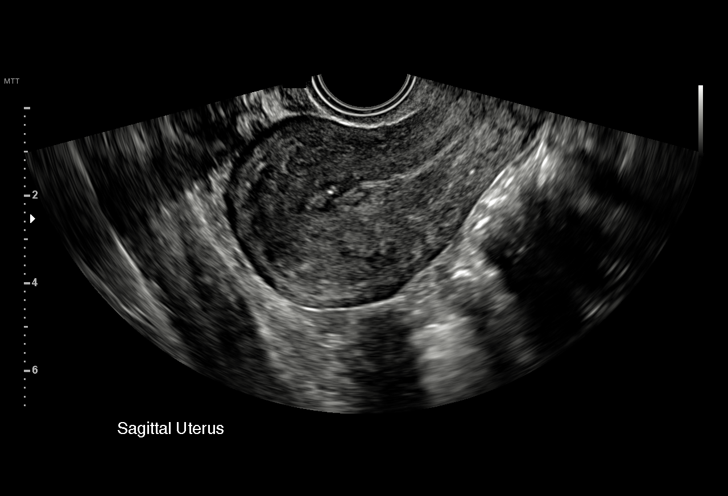
[im 46/88]
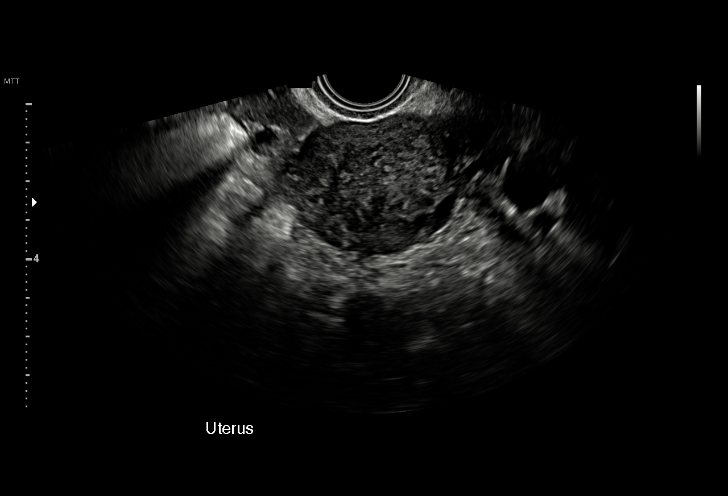
[im 49/88]
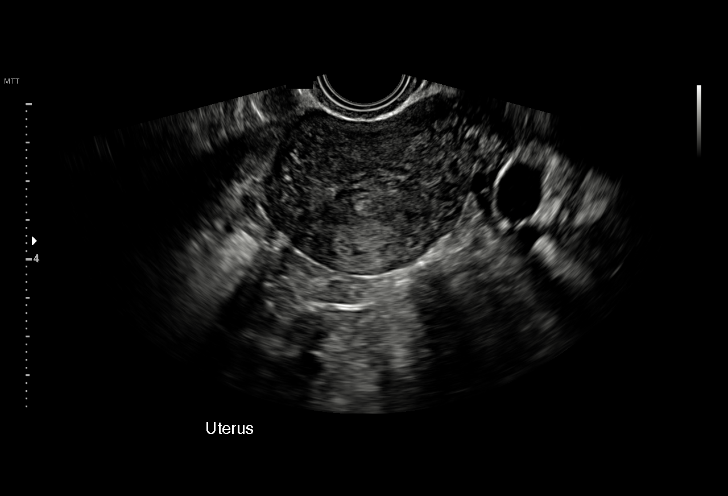
[im 55/88]
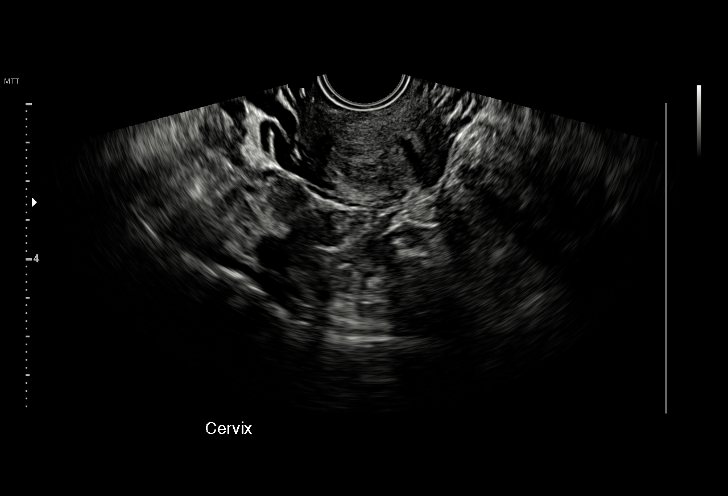
[im 62/88]
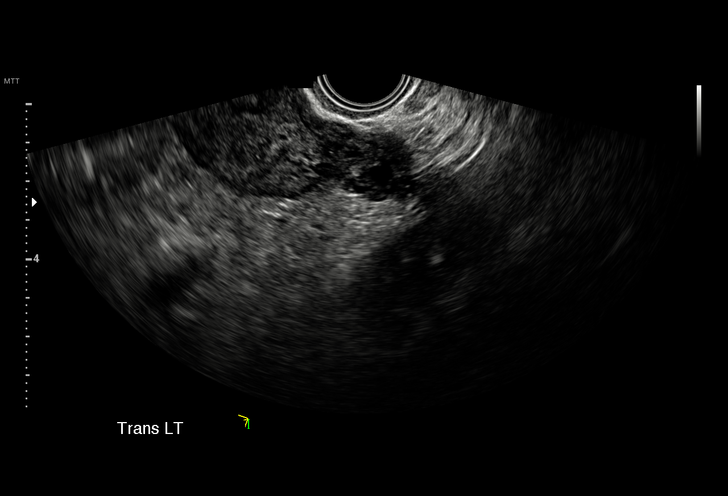
[im 68/88]
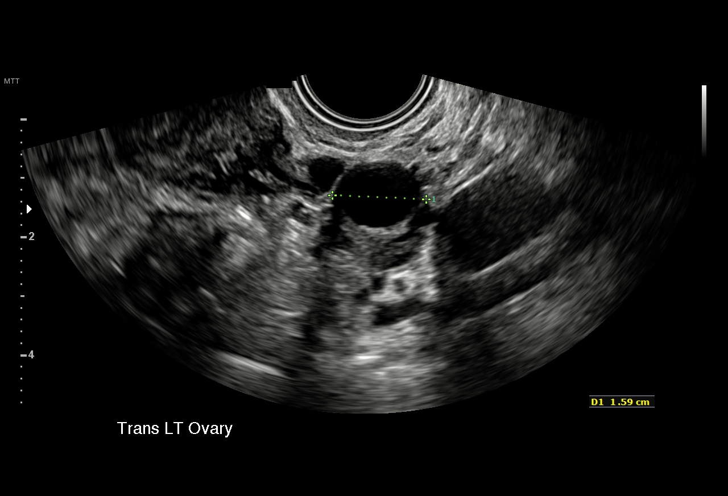
[im 75/88]
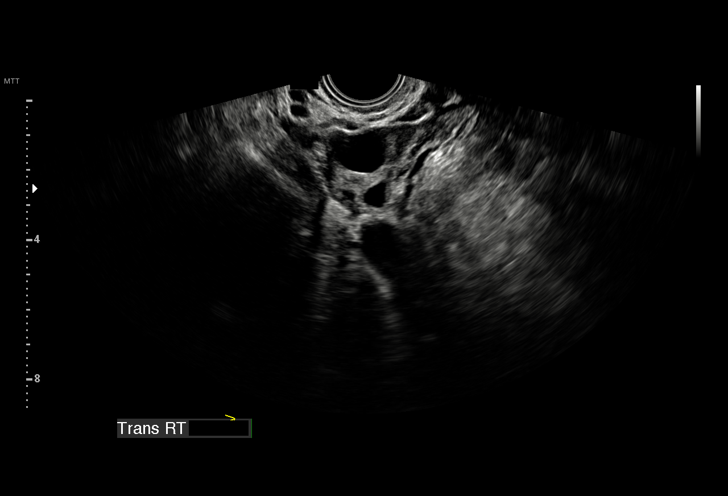
[im 81/88]
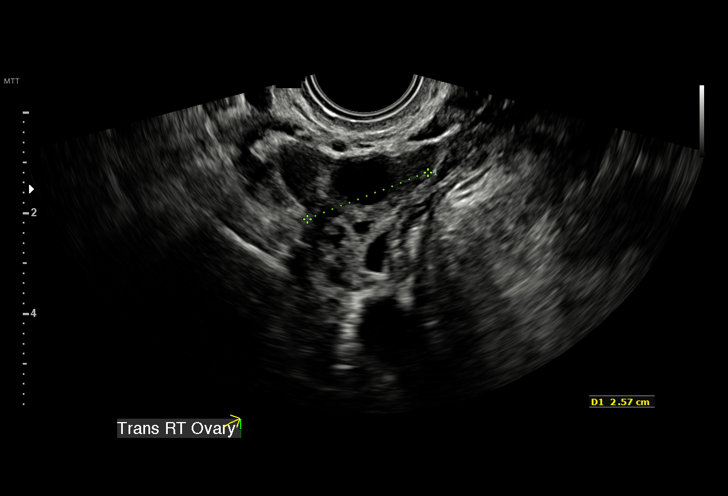
[im 88/88]
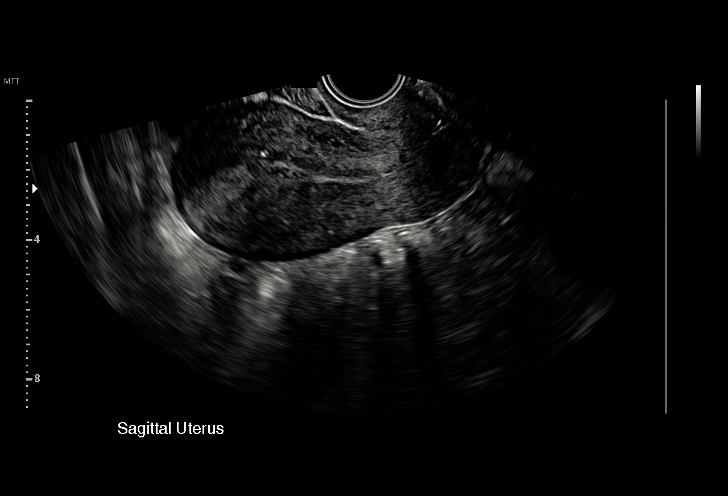

[15 of 28 positions shown; findings below may reference images not displayed]

FINDINGS: Intrauterine gestational sac: Not visualized

Yolk sac:  Not visualized

Embryo:  Not visualized

Maternal uterus/adnexae: Bilateral ovaries are within normal limits.
The right ovary measures 2.6 x 2.9 x 1.8 cm. The left ovary measures
1.6 x 2.8 x 1.8 cm. Trace free fluid in the cul-de-sac.
IMPRESSION: 1. No intrauterine pregnancy is visualized. Findings consistent with
pregnancy of unknown location of which considerations include
intrauterine pregnancy too early to visualize, occult ectopic, and
recent miscarriage
2. Trace free fluid in the cul-de-sac

## 2023-09-08 ENCOUNTER — Other Ambulatory Visit: Payer: Self-pay | Admitting: Nurse Practitioner

## 2023-09-08 DIAGNOSIS — Z1231 Encounter for screening mammogram for malignant neoplasm of breast: Secondary | ICD-10-CM

## 2023-09-26 ENCOUNTER — Ambulatory Visit
Admission: RE | Admit: 2023-09-26 | Discharge: 2023-09-26 | Disposition: A | Discharge status: Home / Self Care | Source: Ambulatory Visit | Attending: Nurse Practitioner | Admitting: Nurse Practitioner

## 2023-09-26 DIAGNOSIS — Z1231 Encounter for screening mammogram for malignant neoplasm of breast: Secondary | ICD-10-CM
